# Patient Record
Sex: Female | Born: 2005 | Race: Black or African American | Hispanic: No | Marital: Single | Smoking: Never smoker
Health system: Southern US, Community
[De-identification: ages and names within clinical notes are randomized; demographics above are authoritative.]

## PROBLEM LIST (undated history)

## (undated) ENCOUNTER — Emergency Department (HOSPITAL_COMMUNITY): Admission: EM | Payer: 59 | Source: Home / Self Care

## (undated) ENCOUNTER — Ambulatory Visit (HOSPITAL_COMMUNITY): Admission: EM | Payer: 59 | Source: Home / Self Care

## (undated) DIAGNOSIS — D649 Anemia, unspecified: Secondary | ICD-10-CM

## (undated) DIAGNOSIS — R519 Headache, unspecified: Secondary | ICD-10-CM

## (undated) HISTORY — DX: Anemia, unspecified: D64.9

## (undated) HISTORY — PX: NO PAST SURGERIES: SHX2092

---
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
## 2023-01-01 ENCOUNTER — Encounter (HOSPITAL_COMMUNITY): Payer: Self-pay | Admitting: *Deleted

## 2023-01-01 ENCOUNTER — Other Ambulatory Visit: Payer: Self-pay

## 2023-01-01 ENCOUNTER — Inpatient Hospital Stay (HOSPITAL_COMMUNITY)
Admission: AD | Admit: 2023-01-01 | Discharge: 2023-01-01 | Disposition: A | Discharge status: Home / Self Care | Payer: 59 | Attending: Emergency Medicine | Admitting: Emergency Medicine

## 2023-01-01 DIAGNOSIS — W19XXXA Unspecified fall, initial encounter: Secondary | ICD-10-CM | POA: Diagnosis not present

## 2023-01-01 DIAGNOSIS — Y9302 Activity, running: Secondary | ICD-10-CM | POA: Insufficient documentation

## 2023-01-01 DIAGNOSIS — O99332 Smoking (tobacco) complicating pregnancy, second trimester: Secondary | ICD-10-CM | POA: Diagnosis not present

## 2023-01-01 DIAGNOSIS — O9A212 Injury, poisoning and certain other consequences of external causes complicating pregnancy, second trimester: Secondary | ICD-10-CM | POA: Diagnosis not present

## 2023-01-01 DIAGNOSIS — S0990XA Unspecified injury of head, initial encounter: Secondary | ICD-10-CM

## 2023-01-01 DIAGNOSIS — S80212A Abrasion, left knee, initial encounter: Secondary | ICD-10-CM

## 2023-01-01 DIAGNOSIS — S40212A Abrasion of left shoulder, initial encounter: Secondary | ICD-10-CM

## 2023-01-01 DIAGNOSIS — Z3A17 17 weeks gestation of pregnancy: Secondary | ICD-10-CM | POA: Diagnosis not present

## 2023-01-01 DIAGNOSIS — S0083XA Contusion of other part of head, initial encounter: Secondary | ICD-10-CM | POA: Diagnosis not present

## 2023-01-01 DIAGNOSIS — R110 Nausea: Secondary | ICD-10-CM | POA: Insufficient documentation

## 2023-01-01 DIAGNOSIS — S80211A Abrasion, right knee, initial encounter: Secondary | ICD-10-CM

## 2023-01-01 DIAGNOSIS — O26892 Other specified pregnancy related conditions, second trimester: Secondary | ICD-10-CM | POA: Insufficient documentation

## 2023-01-01 MED ORDER — ONDANSETRON 4 MG PO TBDP
4.0000 mg | ORAL_TABLET | Freq: Once | ORAL | Status: AC
  Administered 2023-01-01: 4 mg via ORAL
  Filled 2023-01-01: qty 1

## 2023-01-01 NOTE — MAU Note (Signed)
Pt says at 0300 while leaving work- was running in parking lot bc she thought an animal was chasing her.  She fell straight forward - and hit her forehead- on left above eye- is red.   And knees - both scrapped and left shoulder and right hand .  Now feels nausea- has headache and knees burn .

## 2023-01-01 NOTE — ED Provider Notes (Signed)
[place] EMERGENCY DEPARTMENT AT [facility] Provider Note   CSN: [id number] Arrival date & time: 01/01/23  0343     History  Chief Complaint  Patient presents with  . Fall    Jenny [name] is a 17 y.o. female.  17 yo F with a chief complaints of a fall.  The patient was running in a parking lot because she was worried an animal was after her and she lost her balance and fell.  She had up striking her head and skinned both of her knees.  She is [redacted] weeks pregnant and so she went to the [facility] to be evaluated.  They felt that there was unlikely to be an acute issue with her pregnancy and she was sent here for evaluation.  She denies any confusion denies vomiting denies blood thinner use.  She denies chest pain abdominal pain back pain or neck pain.   Fall      Home Medications Prior to Admission medications   Medication Sig Start Date End Date Taking? Authorizing Provider  fluconazole (DIFLUCAN) 150 MG tablet Take 1 tablet today and 1 tablet 3 days from now 07/16/22   Georges Mouse, NP      Allergies    Watermelon [citrullus vulgaris]    Review of Systems   Review of Systems  Physical Exam Updated Vital Signs BP 108/77 (BP Location: Left Arm)   Pulse (!) 101   Temp 98.9 F (37.2 C) (Oral)   Resp 18   Ht 5\' 4"  (1.626 m)   Wt 61 kg   LMP 09/05/2022   SpO2 100%   BMI 23.09 kg/m  Physical Exam Vitals and nursing note reviewed.  Constitutional:      General: She is not in acute distress.    Appearance: She is well-developed. She is not diaphoretic.  HENT:     Head: Normocephalic.     Comments: Abrasion to the left frontal region.  Extraocular motion intact.  No facial nerve palsy.  No obvious bony deformity to the skull. Eyes:     Pupils: Pupils are equal, round, and reactive to light.  Cardiovascular:     Rate and Rhythm: Normal rate and regular rhythm.     Heart sounds: No murmur heard.    No friction rub. No gallop.  Pulmonary:      Effort: Pulmonary effort is normal.     Breath sounds: No wheezing or rales.  Abdominal:     General: There is no distension.     Palpations: Abdomen is soft.     Tenderness: There is no abdominal tenderness.  Musculoskeletal:        General: No tenderness.     Cervical back: Normal range of motion and neck supple.     Comments: Abrasion to bilateral knees.  Able to range without any obvious bony tenderness.  Palpated from head to toe without any noted areas of bony tenderness.  No midline spinal tenderness about her deformities.  She is able to rotate her head 45 degrees in either direction without pain.  Skin:    General: Skin is warm and dry.  Neurological:     Mental Status: She is alert and oriented to person, place, and time.  Psychiatric:        Behavior: Behavior normal.    ED Results / Procedures / Treatments   Labs (all labs ordered are listed, but only abnormal results are displayed) Labs Reviewed - No data to display  EKG  None  Radiology No results found.  Procedures Procedures    Medications Ordered in ED Medications  ondansetron (ZOFRAN-ODT) disintegrating tablet 4 mg (4 mg Oral Given 01/01/23 0423)    ED Course/ Medical Decision Making/ A&P                                 Medical Decision Making  17 yo F with a chief complaints of fall from standing.  I was able to clear her head with [place] head CT rules.  She has no neck pain.  She skinned both of her knees.  I do not feel she would be benefited by any imaging.  She is currently [redacted] weeks pregnant as well.  Tylenol for discomfort.  PCP follow-up.  I did review the patient's medical record and her last tetanus was updated in 2016.   5:10 AM:  I have discussed the diagnosis/risks/treatment options with the patient and friend .  Evaluation and diagnostic testing in the emergency department does not suggest an emergent condition requiring admission or immediate intervention beyond what has been performed  at this time.  They will follow up with PCP. We also discussed returning to the ED immediately if new or worsening sx occur. We discussed the sx which are most concerning (e.g., sudden worsening pain, fever, inability to tolerate by mouth) that necessitate immediate return. Medications administered to the patient during their visit and any new prescriptions provided to the patient are listed below.  Medications given during this visit Medications  ondansetron (ZOFRAN-ODT) disintegrating tablet 4 mg (4 mg Oral Given 01/01/23 0423)     The patient appears reasonably screen and/or stabilized for discharge and I doubt any other medical condition or other [facility] requiring further screening, evaluation, or treatment in the ED at this time prior to discharge.           Final Clinical Impression(s) / ED Diagnoses Final diagnoses:  Contusion, brow, initial encounter  Fall, initial encounter  [redacted] weeks gestation of pregnancy  Abrasion of both knees  Abrasion of left shoulder, initial encounter  Acute headache due to traumatic injury of head  Nausea    Rx / DC Orders ED Discharge Orders          Ordered    Discharge patient        01/01/23 0430              Melene Plan, DO 01/01/23 0510

## 2023-01-01 NOTE — Progress Notes (Signed)
Transferring patient to main ED for evaluation and treatment

## 2023-01-01 NOTE — Discharge Instructions (Signed)
Please return for sudden worsening headache confusion or vomiting.  Please follow-up with your family doctor in the office.  Follow-up with your OB/GYN as well.  I would rinse out your cuts as best as you can under the shower this evening.  You can then apply an ointment a couple times a day.  Typically the plastic surgeons and dermatologist recommend Vaseline.  Please return for redness drainage or fever.

## 2023-01-01 NOTE — MAU Provider Note (Signed)
Chief Complaint:  Fall   None    HPI: Jenny [name] is a 17 y.o. G1P0 at 67w3dwho presents to maternity admissions reporting having fallen while running away from an animal in a parking lot tonight.  Fell straight forward onto her face, hitting her forehead. Also skinned her knees. Left shoulder and right hand.  C/O severe nausea and headache, rates it a "9". She denies LOF, vaginal bleeding, fever/chills.   Fall The accident occurred 1 to 3 hours ago. The fall occurred while running. She landed on [place]. There was no blood loss. The point of impact was the head, face, left shoulder, left knee, right wrist and right knee. The pain is present in the head, back, left knee, right wrist, right knee and left shoulder. The pain is at a severity of 9/10. Associated symptoms include headaches and nausea. Pertinent negatives include no loss of consciousness. She has tried nothing for the symptoms.   RN Note: Pt says at 0300 while leaving work- was running in parking lot bc she thought an animal was chasing her.  She fell straight forward - and hit her forehead- on left above eye- is red.   And knees - both scrapped and left shoulder and right hand .  Now feels nausea- has headache and knees burn .  Past Medical History: Past Medical History:  Diagnosis Date   Headache     Past obstetric history: OB History  Gravida Para Term Preterm AB Living  1            SAB IAB Ectopic Multiple Live Births               # Outcome Date GA Lbr Len/2nd Weight Sex Type Anes PTL Lv  1 Current             Past Surgical History: No past surgical history on file.  Family History: Family History  Problem Relation Age of Onset   Hypertension Father    Cancer Maternal Grandfather        LUNG   Asthma Paternal Grandmother    Hypertension Paternal Grandmother    Hypertension Paternal Grandfather    Stroke Paternal Grandfather    Alcohol abuse Neg Hx    Arthritis Neg Hx    Birth defects Neg  Hx    COPD Neg Hx    Depression Neg Hx    Diabetes Neg Hx    Drug abuse Neg Hx    Early death Neg Hx    Hearing loss Neg Hx    Heart disease Neg Hx    Hyperlipidemia Neg Hx    Learning disabilities Neg Hx    Kidney disease Neg Hx    Mental illness Neg Hx    Mental retardation Neg Hx    Miscarriages / Stillbirths Neg Hx    Vision loss Neg Hx    Varicose Veins Neg Hx     Social History: Social History   Tobacco Use   Smoking status: Never    Passive exposure: Yes   Smokeless tobacco: Never   Tobacco comments:    vape  Vaping Use   Vaping status: Some Days  Substance Use Topics   Alcohol use: Never   Drug use: Never    Allergies:  Allergies  Allergen Reactions   Watermelon [Citrullus Vulgaris] Other (See Comments)    Mouth starts burning (patient reported)    Meds:  Medications Prior to Admission  Medication Sig Dispense Refill Last Dose  fluconazole (DIFLUCAN) 150 MG tablet Take 1 tablet today and 1 tablet 3 days from now 2 tablet 0    metroNIDAZOLE (FLAGYL) 500 MG tablet Take 1 tablet (500 mg total) by mouth 2 (two) times daily. 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 (Patient not taking: Reported on 07/15/2022) 30 tablet 0    valACYclovir (VALTREX) 1000 MG tablet Take 1 tablet (1,000 mg total) by mouth daily. 60 tablet 0     I have reviewed patient's Past Medical Hx, Surgical Hx, Family Hx, Social Hx, medications and allergies.   ROS:  Review of Systems  Gastrointestinal:  Positive for nausea.  Neurological:  Positive for headaches. Negative for loss of consciousness.   Other systems negative  Physical Exam  Patient Vitals for the past 24 hrs:  BP Temp Pulse Resp Height Weight  01/01/23 0401 109/73 98.2 F (36.8 C) (!) 102 18 5\' 4"  (1.626 m) 61 kg   Constitutional: Well-developed, well-nourished female in no acute distress.   Cardiovascular: normal rate  Respiratory: normal effort GI: Abd soft, non-tender, gravid appropriate for gestational age.   No rebound or guarding. MS: Extremities nontender, no edema, normal ROM Neurologic: Alert and oriented x 4.  GU: Neg CVAT.   FHT:  150     Labs: No results found for this or any previous visit (from the past 24 hour(s)).    Imaging:  No results found.  MAU Course/MDM: I have reviewed the triage vital signs and the nursing notes.   Pertinent labs & imaging results that were available during my care of the patient were reviewed by me and considered in my medical decision making (see chart for details).      I have reviewed her medical records including past results, notes and treatments.   I have ordered Zofran ODT for nausea prior to transport.   Consult Dr Pilar Plate with presentation, exam findings.  Treatments in MAU included Zofran.    Assessment: [place] IUP at [redacted]w[redacted]d Fall Left brow contusion Bilateral abrasions to knees Left shoulder abrasion Severe headache Severe nausea  Plan: Discharge to Main ED, Dr Pilar Plate accepting Escorted by wheelchair  Follow up in Office for prenatal visits and recheck  Pt stable at time of Transfer Encouraged to return if she develops worsening of symptoms, increase in pain, fever, or other concerning symptoms.    Wynelle Bourgeois CNM, MSN Certified Nurse-Midwife 01/01/2023 4:19 AM

## 2023-01-01 NOTE — ED Triage Notes (Addendum)
Patient transferred over from MAU after evaluation from a fall she had at 3 am today. States she tripped and fell while running and fell onto concrete hitting her head. Abrasions noted to the knees as well. Patient Aox4. Patient is [redacted] weeks pregnant.

## 2023-01-02 ENCOUNTER — Encounter: Payer: Self-pay | Admitting: Family

## 2023-01-07 ENCOUNTER — Encounter: Payer: Self-pay | Admitting: Pediatrics

## 2023-01-08 ENCOUNTER — Ambulatory Visit (INDEPENDENT_AMBULATORY_CARE_PROVIDER_SITE_OTHER): Payer: 59 | Admitting: *Deleted

## 2023-01-08 DIAGNOSIS — Z3402 Encounter for supervision of normal first pregnancy, second trimester: Secondary | ICD-10-CM

## 2023-01-08 DIAGNOSIS — Z3480 Encounter for supervision of other normal pregnancy, unspecified trimester: Secondary | ICD-10-CM | POA: Insufficient documentation

## 2023-01-08 DIAGNOSIS — Z3A18 18 weeks gestation of pregnancy: Secondary | ICD-10-CM

## 2023-01-08 DIAGNOSIS — Z3482 Encounter for supervision of other normal pregnancy, second trimester: Secondary | ICD-10-CM

## 2023-01-08 MED ORDER — BLOOD PRESSURE KIT DEVI
1.0000 | 0 refills | Status: AC
Start: 2023-01-08 — End: ?

## 2023-01-08 NOTE — Patient Instructions (Signed)

## 2023-01-08 NOTE — Progress Notes (Signed)
New OB Intake  I connected with Jenny [name]  on 01/08/23 at  1:10 PM EDT by Virtual Visit and verified that I am speaking with the correct person using two identifiers. Nurse is located at [facility] and pt is located at Home.  I discussed the limitations, risks, security and privacy concerns of performing an evaluation and management service by telephone and the availability of in person appointments. I also discussed with the patient that there may be a patient responsible charge related to this service. The patient expressed understanding and agreed to proceed.  I explained I am completing New OB Intake today. We discussed EDD of 06/08/2023, by Ultrasound. Pt is G1P0000. I reviewed her allergies, medications and Medical/Surgical/OB history.    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 Discussed the nature of our practice with multiple providers including residents and students. Due to the size of the practice, the delivering provider may not be the same as those providing prenatal care.   Patient is not interested in water birth. Offered upcoming OB visit with CNM to discuss further.  MyChart/Babyscripts MyChart access verified. I explained pt will have some visits in office and some virtually. Babyscripts instructions given and order placed. Patient verifies receipt of registration text/e-mail. Account successfully created and app downloaded.  Blood Pressure Cuff/Weight Scale Blood pressure cuff ordered for patient to pick-up from [employer]. Explained after first prenatal appt pt will check weekly and document  in Babyscripts. Patient does not have weight scale; patient may purchase if they desire to track weight weekly in Babyscripts.  Anatomy US Explained first scheduled [place] will be around 19 weeks. Anatomy US scheduled for 01/30/23 at 12:15.  Interested in [place]? If yes, send referral and doula dot phrase.   Is patient a candidate for Babyscripts Optimization? Yes  First visit review I reviewed new OB appt with patient. Explained pt will be seen by Dr. Debroah Loop at first visit. Discussed Avelina Laine genetic screening with patient. NA Panorama and Horizon.. Routine prenatal labs  not collected/ virtual visit/ had labs at [facility]    Last Pap No results found for: "DIAGPAP"  Harrel Lemon, RN 01/08/2023  2:01 PM

## 2023-01-29 ENCOUNTER — Ambulatory Visit (HOSPITAL_COMMUNITY)
Admission: EM | Admit: 2023-01-29 | Discharge: 2023-01-29 | Discharge status: Against Medical Advice | Payer: 59 | Attending: Sports Medicine | Admitting: Sports Medicine

## 2023-01-29 ENCOUNTER — Encounter (HOSPITAL_COMMUNITY): Payer: Self-pay

## 2023-01-29 DIAGNOSIS — Z3A21 21 weeks gestation of pregnancy: Secondary | ICD-10-CM | POA: Insufficient documentation

## 2023-01-29 DIAGNOSIS — R519 Headache, unspecified: Secondary | ICD-10-CM | POA: Insufficient documentation

## 2023-01-29 DIAGNOSIS — O99512 Diseases of the respiratory system complicating pregnancy, second trimester: Secondary | ICD-10-CM | POA: Insufficient documentation

## 2023-01-29 DIAGNOSIS — J029 Acute pharyngitis, unspecified: Secondary | ICD-10-CM | POA: Insufficient documentation

## 2023-01-29 DIAGNOSIS — Z1152 Encounter for screening for COVID-19: Secondary | ICD-10-CM | POA: Diagnosis not present

## 2023-01-29 LAB — POCT RAPID STREP A (OFFICE): Rapid Strep A Screen: NEGATIVE

## 2023-01-29 LAB — SARS CORONAVIRUS 2 (TAT 6-24 HRS): SARS Coronavirus 2: NEGATIVE

## 2023-01-29 NOTE — ED Triage Notes (Signed)
Patient here today with c/o ST and headaches X 3 days. She has tried taking Nyquil with some relief. She is [redacted] weeks pregnant. Her cousin has also been sick.

## 2023-01-30 ENCOUNTER — Ambulatory Visit: Payer: 59 | Admitting: *Deleted

## 2023-01-30 ENCOUNTER — Other Ambulatory Visit: Payer: Self-pay | Admitting: *Deleted

## 2023-01-30 ENCOUNTER — Ambulatory Visit: Payer: 59 | Attending: Obstetrics and Gynecology

## 2023-01-30 VITALS — BP 120/77 | HR 108

## 2023-01-30 DIAGNOSIS — O98312 Other infections with a predominantly sexual mode of transmission complicating pregnancy, second trimester: Secondary | ICD-10-CM | POA: Insufficient documentation

## 2023-01-30 DIAGNOSIS — Z363 Encounter for antenatal screening for malformations: Secondary | ICD-10-CM | POA: Insufficient documentation

## 2023-01-30 DIAGNOSIS — Z3482 Encounter for supervision of other normal pregnancy, second trimester: Secondary | ICD-10-CM | POA: Diagnosis not present

## 2023-01-30 DIAGNOSIS — A6000 Herpesviral infection of urogenital system, unspecified: Secondary | ICD-10-CM | POA: Insufficient documentation

## 2023-01-30 DIAGNOSIS — Z3A21 21 weeks gestation of pregnancy: Secondary | ICD-10-CM | POA: Insufficient documentation

## 2023-01-30 DIAGNOSIS — Z362 Encounter for other antenatal screening follow-up: Secondary | ICD-10-CM

## 2023-01-30 DIAGNOSIS — O09892 Supervision of other high risk pregnancies, second trimester: Secondary | ICD-10-CM

## 2023-02-03 ENCOUNTER — Inpatient Hospital Stay (HOSPITAL_COMMUNITY)
Admission: AD | Admit: 2023-02-03 | Discharge: 2023-02-03 | Disposition: A | Discharge status: Home / Self Care | Payer: 59 | Attending: Obstetrics and Gynecology | Admitting: Obstetrics and Gynecology

## 2023-02-03 ENCOUNTER — Encounter (HOSPITAL_COMMUNITY): Payer: Self-pay | Admitting: Obstetrics and Gynecology

## 2023-02-03 DIAGNOSIS — O212 Late vomiting of pregnancy: Secondary | ICD-10-CM | POA: Diagnosis not present

## 2023-02-03 DIAGNOSIS — O98512 Other viral diseases complicating pregnancy, second trimester: Secondary | ICD-10-CM | POA: Diagnosis present

## 2023-02-03 DIAGNOSIS — O99512 Diseases of the respiratory system complicating pregnancy, second trimester: Secondary | ICD-10-CM | POA: Diagnosis not present

## 2023-02-03 DIAGNOSIS — B9789 Other viral agents as the cause of diseases classified elsewhere: Secondary | ICD-10-CM | POA: Diagnosis not present

## 2023-02-03 DIAGNOSIS — Z1152 Encounter for screening for COVID-19: Secondary | ICD-10-CM | POA: Diagnosis not present

## 2023-02-03 DIAGNOSIS — Z3A22 22 weeks gestation of pregnancy: Secondary | ICD-10-CM | POA: Diagnosis not present

## 2023-02-03 DIAGNOSIS — J069 Acute upper respiratory infection, unspecified: Secondary | ICD-10-CM | POA: Insufficient documentation

## 2023-02-03 LAB — URINALYSIS, ROUTINE W REFLEX MICROSCOPIC
Bilirubin Urine: NEGATIVE
Glucose, UA: NEGATIVE mg/dL
Hgb urine dipstick: NEGATIVE
Ketones, ur: NEGATIVE mg/dL
Nitrite: NEGATIVE
Protein, ur: NEGATIVE mg/dL
Specific Gravity, Urine: 1.006 (ref 1.005–1.030)
pH: 7 (ref 5.0–8.0)

## 2023-02-03 LAB — RESP PANEL BY RT-PCR (RSV, FLU A&B, COVID)  RVPGX2
Influenza A by PCR: NEGATIVE
Influenza B by PCR: NEGATIVE
Resp Syncytial Virus by PCR: NEGATIVE
SARS Coronavirus 2 by RT PCR: NEGATIVE

## 2023-02-03 MED ORDER — PROMETHAZINE HCL 25 MG PO TABS
12.5000 mg | ORAL_TABLET | Freq: Once | ORAL | Status: AC
  Administered 2023-02-03: 12.5 mg via ORAL
  Filled 2023-02-03: qty 1

## 2023-02-03 MED ORDER — PROMETHAZINE HCL 25 MG PO TABS: 12.5000 mg | ORAL_TABLET | Freq: Four times a day (QID) | ORAL | 5 refills | Status: DC | PRN

## 2023-02-03 MED ORDER — PRENATAL VITAMIN 27-0.8 MG PO TABS: 1.0000 | ORAL_TABLET | Freq: Every day | ORAL | 11 refills | Status: AC

## 2023-02-03 MED ORDER — ACETAMINOPHEN 500 MG PO TABS
1000.0000 mg | ORAL_TABLET | Freq: Once | ORAL | Status: AC
  Administered 2023-02-03: 1000 mg via ORAL
  Filled 2023-02-03: qty 2

## 2023-02-03 NOTE — MAU Note (Signed)
Jenny [name] is a 17 y.o. at [redacted]w[redacted]d here in MAU reporting: been running a fever for like a wk, has "been in the 100's, today it was 102.1".  having chills every day.  Can't really eat, when she does- she throws up.  (Also for a wk).  Initially had a sore throat- no longer does.  Non- productive cough.  Cousin and aunt had the same thing, has been tested for covid and strep- were neg.Denies diarrhea. Denies urinary problems or back pain. Onset of complaint: a wk Pain score: HA 7 Vitals:   02/03/23 1344  BP: 114/70  Pulse: (!) 120  Resp: (!) 22  Temp: (!) 103 F (39.4 C)  SpO2: 100%     FHT:177 Lab orders placed from triage:  urine

## 2023-02-03 NOTE — MAU Provider Note (Signed)
History     [id number]  Arrival date and time: 02/03/23 1307    Chief Complaint  Patient presents with   Fever   Headache   Emesis     HPI Jenny [name] is a 17 y.o. at [redacted]w[redacted]d, who presents for fever.   Patient reports about one week of symptoms including fevers, nasal congestion, and mild non-productive cough In addition has also had lots of nausea and vomiting, has been able to drink water but is not eating much Has taken zofran but not working for her nausea Has taken tylenol which is somewhat effective for fever Cousin and and aunt had the same symptoms Also having a mild headache No shortness of breath or chest pain No abdominal pain, no diarrhea No burning or pain with urination No back pain No vaginal discharge No vaginal bleeding No contractions No leaking fluid   O/Positive/-- (07/29 0000)  OB History     Gravida  1   Para  0   Term  0   Preterm  0   AB  0   Living  0      SAB  0   IAB  0   Ectopic  0   Multiple  0   Live Births  0           Past Medical History:  Diagnosis Date   Anemia    BMI (body mass index), pediatric, 5% to less than 85% for age 42/02/2015   Encounter for routine child health examination without abnormal findings 02/07/2015   Headache    Vulvar pain 09/25/2021    Past Surgical History:  Procedure Laterality Date   NO PAST SURGERIES      Family History  Problem Relation Age of Onset   Hypertension Father    Asthma Paternal Grandmother    Hypertension Paternal Grandmother    Cancer Paternal Grandfather    Hypertension Paternal Grandfather    Stroke Paternal Grandfather    Alcohol abuse Neg Hx    Arthritis Neg Hx    Birth defects Neg Hx    COPD Neg Hx    Depression Neg Hx    Diabetes Neg Hx    Drug abuse Neg Hx    Early death Neg Hx    Hearing loss Neg Hx    Heart disease Neg Hx    Hyperlipidemia Neg Hx    Learning disabilities Neg Hx    Kidney disease Neg Hx    Mental  illness Neg Hx    Mental retardation Neg Hx    Miscarriages / Stillbirths Neg Hx    Vision loss Neg Hx    Varicose Veins Neg Hx     Social History   Socioeconomic History   Marital status: Single    Spouse name: Not on file   Number of children: Not on file   Years of education: Not on file   Highest education level: Not on file  Occupational History   Not on file  Tobacco Use   Smoking status: Never    Passive exposure: Yes   Smokeless tobacco: Never   Tobacco comments:    vape  Vaping Use   Vaping status: Former   Quit date: 09/27/2021  Substance and Sexual Activity   Alcohol use: Never   Drug use: Never   Sexual activity: Yes    Birth control/protection: None  Other Topics Concern   Not on file  Social History Narrative   11th grade at [employer]  High   Social Determinants of Health   Financial Resource Strain: Not on file  Food Insecurity: Not on file  Transportation Needs: Not on file  Physical Activity: Not on file  Stress: Not on file  Social Connections: Not on file  Intimate Partner Violence: Not on file    Allergies  Allergen Reactions   Watermelon [Citrullus Vulgaris] Other (See Comments)    Mouth starts burning (patient reported)    No current facility-administered medications on file prior to encounter.   Current Outpatient Medications on File Prior to Encounter  Medication Sig Dispense Refill   Blood Pressure Monitoring (BLOOD PRESSURE KIT) DEVI 1 Device by Does not apply route once a week. (Patient not taking: Reported on 01/30/2023) 1 each 0     ROS Pertinent positives and negative per HPI, all others reviewed and negative  Physical Exam   BP 126/81   Pulse 94   Temp (!) 102 F (38.9 C) (Oral)   Resp (!) 24   Ht 5\' 4"  (1.626 m)   Wt 62.2 kg   LMP 09/01/2022   SpO2 100%   BMI 23.55 kg/m   Patient Vitals for the past 24 hrs:  BP Temp Temp src Pulse Resp SpO2 Height Weight  02/03/23 1616 -- (!) 102 F (38.9 C) Oral -- -- -- --  --  02/03/23 1400 126/81 -- -- 94 (!) 24 -- -- --  02/03/23 1344 114/70 (!) 103 F (39.4 C) Oral (!) 120 (!) 22 100 % 5\' 4"  (1.626 m) 62.2 kg    Physical Exam Constitutional:      General: She is not in acute distress.    Appearance: She is well-developed. She is not ill-appearing, toxic-appearing or diaphoretic.  HENT:     Head: Normocephalic and atraumatic.     Mouth/Throat:     Mouth: Mucous membranes are moist.  Cardiovascular:     Rate and Rhythm: Normal rate and regular rhythm.     Heart sounds: Normal heart sounds.  Pulmonary:     Effort: Pulmonary effort is normal. No respiratory distress.     Breath sounds: Normal breath sounds. No stridor. No wheezing, rhonchi or rales.  Abdominal:     General: Bowel sounds are normal. There is no distension.     Palpations: Abdomen is soft.     Tenderness: There is no abdominal tenderness.  Skin:    General: Skin is warm and dry.  Neurological:     Mental Status: She is alert.     Coordination: Coordination normal.     Gait: Gait normal.  Psychiatric:        Mood and Affect: Mood normal.        Behavior: Behavior normal.      Cervical Exam    Bedside Ultrasound Not performed.  My interpretation: n/a   Labs Results for orders placed or performed during the hospital encounter of 02/03/23 (from the past 24 hour(s))  Urinalysis, Routine w reflex microscopic -Urine, Clean Catch     Status: Abnormal   Collection Time: 02/03/23  2:01 PM  Result Value Ref Range   Color, Urine YELLOW YELLOW   APPearance HAZY (A) CLEAR   Specific Gravity, Urine 1.006 1.005 - 1.030   pH 7.0 5.0 - 8.0   Glucose, UA NEGATIVE NEGATIVE mg/dL   Hgb urine dipstick NEGATIVE NEGATIVE   Bilirubin Urine NEGATIVE NEGATIVE   Ketones, ur NEGATIVE NEGATIVE mg/dL   Protein, ur NEGATIVE NEGATIVE mg/dL   Nitrite NEGATIVE NEGATIVE  Leukocytes,Ua LARGE (A) NEGATIVE   RBC / HPF 0-5 0 - 5 RBC/hpf   WBC, UA 11-20 0 - 5 WBC/hpf   Bacteria, UA RARE (A) NONE  SEEN   Squamous Epithelial / HPF 11-20 0 - 5 /HPF  Resp panel by RT-PCR (RSV, Flu A&B, Covid) Anterior Nasal Swab     Status: None   Collection Time: 02/03/23  2:43 PM   Specimen: Anterior Nasal Swab  Result Value Ref Range   SARS Coronavirus 2 by RT PCR NEGATIVE NEGATIVE   Influenza A by PCR NEGATIVE NEGATIVE   Influenza B by PCR NEGATIVE NEGATIVE   Resp Syncytial Virus by PCR NEGATIVE NEGATIVE    Imaging No results found.  MAU Course  Procedures  Lab Orders         Resp panel by RT-PCR (RSV, Flu A&B, Covid) Anterior Nasal Swab         Urinalysis, Routine w reflex microscopic -Urine, Clean Catch    Meds ordered this encounter  Medications   acetaminophen (TYLENOL) tablet 1,000 mg   promethazine (PHENERGAN) tablet 12.5 mg   Prenatal Vit-Fe Fumarate-FA (PRENATAL VITAMIN) 27-0.8 MG TABS    Sig: Take 1 tablet by mouth daily.    Dispense:  30 tablet    Refill:  11   promethazine (PHENERGAN) 25 MG tablet    Sig: Take 0.5 tablets (12.5 mg total) by mouth every 6 (six) hours as needed for nausea or vomiting.    Dispense:  30 tablet    Refill:  5   Imaging Orders  No imaging studies ordered today    MDM Moderate (Level 3-4)  Assessment and Plan  # Viral URI with ocugh #[redacted] weeks gestation of pregnancy Well appearing on exam, no signs of respiratory distress or dehydration. Respiratory viral panel negative. Supportive care with tylenol, fluids. Given phenergan for home.    Dispo: discharged to home in stable condition   Venora Maples, MD/MPH 02/03/23 4:20 PM  Allergies as of 02/03/2023       Reactions   Watermelon [citrullus Vulgaris] Other (See Comments)   Mouth starts burning (patient reported)        Medication List     TAKE these medications    Blood Pressure Kit Devi 1 Device by Does not apply route once a week.   Prenatal Vitamin 27-0.8 MG Tabs Take 1 tablet by mouth daily. What changed: medication strength   promethazine 25 MG  tablet Commonly known as: PHENERGAN Take 0.5 tablets (12.5 mg total) by mouth every 6 (six) hours as needed for nausea or vomiting.

## 2023-02-04 ENCOUNTER — Ambulatory Visit: Payer: 59 | Admitting: Obstetrics & Gynecology

## 2023-02-04 VITALS — BP 106/72 | HR 99 | Wt 141.0 lb

## 2023-02-04 DIAGNOSIS — Z3A22 22 weeks gestation of pregnancy: Secondary | ICD-10-CM

## 2023-02-04 DIAGNOSIS — Z3402 Encounter for supervision of normal first pregnancy, second trimester: Secondary | ICD-10-CM | POA: Diagnosis not present

## 2023-02-04 DIAGNOSIS — Z3480 Encounter for supervision of other normal pregnancy, unspecified trimester: Secondary | ICD-10-CM

## 2023-02-04 MED ORDER — FERROUS SULFATE 325 (65 FE) MG PO TABS
325.0000 mg | ORAL_TABLET | ORAL | 3 refills | Status: AC
Start: 2023-02-04 — End: ?

## 2023-02-04 NOTE — Progress Notes (Signed)
  Subjective: transfer from [facility]     [employer] is a G1P0000 [redacted]w[redacted]d being seen today for her first obstetrical visit.  Her obstetrical history is significant for  anemia . Patient does intend to breast feed. Pregnancy history fully reviewed.  Patient reports no complaints.  Vitals:   02/04/23 1057  BP: 106/72  Pulse: 99  Weight: 141 lb (64 kg)    HISTORY: OB History  Gravida Para Term Preterm AB Living  1 0 0 0 0 0  SAB IAB Ectopic Multiple Live Births  0 0 0 0 0    # Outcome Date GA Lbr Len/2nd Weight Sex Type Anes PTL Lv  1 Current            Past Medical History:  Diagnosis Date   Anemia    BMI (body mass index), pediatric, 5% to less than 85% for age 47/02/2015   Encounter for routine child health examination without abnormal findings 02/07/2015   Headache    Vulvar pain 09/25/2021   Past Surgical History:  Procedure Laterality Date   NO PAST SURGERIES     Family History  Problem Relation Age of Onset   Hypertension Father    Asthma Paternal Grandmother    Hypertension Paternal Grandmother    Cancer Paternal Grandfather    Hypertension Paternal Grandfather    Stroke Paternal Grandfather    Alcohol abuse Neg Hx    Arthritis Neg Hx    Birth defects Neg Hx    COPD Neg Hx    Depression Neg Hx    Diabetes Neg Hx    Drug abuse Neg Hx    Early death Neg Hx    Hearing loss Neg Hx    Heart disease Neg Hx    Hyperlipidemia Neg Hx    Learning disabilities Neg Hx    Kidney disease Neg Hx    Mental illness Neg Hx    Mental retardation Neg Hx    Miscarriages / Stillbirths Neg Hx    Vision loss Neg Hx    Varicose Veins Neg Hx      Exam    Uterus:   22 weeks  Pelvic Exam: deferred   Perineum:    Vulva:    Vagina:     pH:    Cervix:    Adnexa:    Bony Pelvis:   System: Breast:     Skin:     Neurologic: oriented, normal mood   Extremities: normal strength, tone, and muscle mass   HEENT PERRLA   Mouth/Teeth mucous membranes moist,  pharynx normal without lesions and dental hygiene good   Neck supple   Cardiovascular: regular rate and rhythm   Respiratory:  appears well, vitals normal, no respiratory distress, acyanotic, normal RR   Abdomen: soft, non-tender; bowel sounds normal; no masses,  no organomegaly   Urinary:       Assessment:    Pregnancy: G1P0000 Patient Active Problem List   Diagnosis Date Noted   Supervision of other normal pregnancy, antepartum 01/08/2023   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 Prenatal vitamins. Problem list reviewed and updated. Genetic Screening discussed : declined.  Ultrasound discussed; fetal survey: results reviewed.  Follow up in 5 weeks. 50% of 30 min visit spent on counseling and coordination of care.     Scheryl Darter 02/04/2023

## 2023-02-19 ENCOUNTER — Ambulatory Visit (HOSPITAL_COMMUNITY): Payer: Self-pay

## 2023-02-20 ENCOUNTER — Ambulatory Visit (HOSPITAL_COMMUNITY)
Admission: RE | Admit: 2023-02-20 | Discharge: 2023-02-20 | Disposition: A | Discharge status: Home / Self Care | Payer: 59 | Source: Ambulatory Visit | Attending: Emergency Medicine | Admitting: Emergency Medicine

## 2023-02-20 ENCOUNTER — Encounter (HOSPITAL_COMMUNITY): Payer: Self-pay

## 2023-02-20 VITALS — BP 114/68 | HR 74 | Temp 98.4°F | Resp 16

## 2023-02-20 DIAGNOSIS — N898 Other specified noninflammatory disorders of vagina: Secondary | ICD-10-CM | POA: Insufficient documentation

## 2023-02-20 NOTE — ED Triage Notes (Signed)
Patient is here today for vaginal discharge x 2 days. Pt states she is [redacted] weeks pregnant.

## 2023-02-20 NOTE — ED Provider Notes (Signed)
[facility]    CSN: [id number] Arrival date & time: 02/20/23  1326     History   Chief Complaint Chief Complaint  Patient presents with   Vaginal Discharge    Yeast check - Entered by patient    HPI Jenny [name] is a 17 y.o. female.  2 day history of vaginal discharge No odor or itching  She is [redacted] weeks pregnant. Followed by [facility]. Next appt is 10/31  In June was dx with yeast infection  Not having urinary symptoms. 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 Authorizing Provider  Blood Pressure Monitoring (BLOOD PRESSURE KIT) DEVI 1 Device by Does not apply route once a week. 01/08/23  Yes Warden Fillers, MD  Prenatal Vit-Fe Fumarate-FA (PRENATAL VITAMIN) 27-0.8 MG TABS Take 1 tablet by mouth daily. 02/03/23  Yes Venora Maples, MD  ferrous sulfate 325 (65 FE) MG tablet Take 1 tablet (325 mg total) by mouth every other day. 02/04/23   Adam Phenix, MD  promethazine (PHENERGAN) 25 MG tablet Take 0.5 tablets (12.5 mg total) by mouth every 6 (six) hours as needed for nausea or vomiting. 02/03/23   Venora Maples, MD    Family History Family  History  Problem Relation Age of Onset   Hypertension Father    Asthma Paternal Grandmother    Hypertension Paternal Grandmother    Cancer Paternal Grandfather    Hypertension Paternal Grandfather    Stroke Paternal Grandfather    Alcohol abuse Neg Hx    Arthritis Neg Hx    Birth defects Neg Hx    COPD Neg Hx    Depression Neg Hx    Diabetes Neg Hx    Drug abuse Neg Hx    Early death Neg Hx    Hearing loss Neg Hx    Heart disease Neg Hx    Hyperlipidemia Neg Hx    Learning disabilities Neg Hx    Kidney disease Neg Hx    Mental illness Neg Hx    Mental retardation Neg Hx    Miscarriages / Stillbirths Neg Hx    Vision loss Neg Hx    Varicose Veins Neg Hx     Social History Social History   Tobacco Use   Smoking status: Never    Passive exposure: Yes   Smokeless tobacco: Never   Tobacco comments:  vape  Vaping Use   Vaping status: Former   Quit date: 09/27/2021  Substance Use Topics   Alcohol use: Never   Drug use: Never     Allergies   Watermelon [citrullus vulgaris]   Review of Systems Review of Systems  Genitourinary:  Positive for vaginal discharge.   Per HPI  Physical Exam Triage Vital Signs ED Triage Vitals  Encounter Vitals Group     BP 02/20/23 1336 114/68     Systolic BP Percentile --      Diastolic BP Percentile --      Pulse Rate 02/20/23 1336 74     Resp 02/20/23 1336 16     Temp 02/20/23 1336 98.4 F (36.9 C)     Temp Source 02/20/23 1336 Oral     SpO2 02/20/23 1336 100 %     Weight --      Height --      Head Circumference --      Peak Flow --      Pain Score 02/20/23 1343 0     Pain Loc --      Pain Education --      Exclude from Growth Chart --    No data found.  Updated Vital Signs BP 114/68 (BP Location: Left Arm)   Pulse 74   Temp 98.4 F (36.9 C) (Oral)   Resp 16   LMP 09/01/2022   SpO2 100%   Physical Exam Vitals and nursing note reviewed.  Constitutional:      General: She is not in acute distress.     Appearance: Normal appearance.  Cardiovascular:     Rate and Rhythm: Normal rate and regular rhythm.     Pulses: Normal pulses.     Heart sounds: Normal heart sounds.  Pulmonary:     Effort: Pulmonary effort is normal.     Breath sounds: Normal breath sounds.  Abdominal:     Tenderness: There is no abdominal tenderness.     Comments: gravid  Neurological:     Mental Status: She is alert and oriented to person, place, and time.     UC Treatments / Results  Labs (all labs ordered are listed, but only abnormal results are displayed) Labs Reviewed  CERVICOVAGINAL ANCILLARY ONLY  CERVICOVAGINAL ANCILLARY ONLY    EKG   Radiology No results found.  Procedures Procedures (including critical care time)  Medications Ordered in UC Medications - No data to display  Initial Impression / Assessment and Plan / UC Course  I have reviewed the triage vital signs and the nursing notes.  Pertinent labs & imaging results that were available during my care of the patient were reviewed by me and considered in my medical decision making (see chart for details).  Cytoswab pending for BV/yeast along with STDs per patient request Await results and treat positive as indicated If yeast recommend clotrimazole vaginally x 7 nights If BV can do oral flagyl BID x 7 days since she is past 1st trimester  Final Clinical Impressions(s) / UC Diagnoses   Final diagnoses:  Vaginal discharge     Discharge Instructions      We will call you if anything on your swab returns positive. You can also see these results on MyChart. Please abstain from sexual intercourse until your results return.  Follow up with OB at next appointment      ED Prescriptions   None    PDMP not reviewed this encounter.   Olivia Royse, Ray Church 02/20/23  1511  

## 2023-02-20 NOTE — Discharge Instructions (Signed)
We will call you if anything on your swab returns positive. You can also see these results on MyChart. Please abstain from sexual intercourse until your results return.  Follow up with OB at next appointment

## 2023-02-21 ENCOUNTER — Telehealth (HOSPITAL_COMMUNITY): Payer: Self-pay | Admitting: Emergency Medicine

## 2023-02-21 MED ORDER — METRONIDAZOLE 0.75 % VA GEL: 1.0000 | Freq: Every day | VAGINAL | 0 refills | Status: AC

## 2023-02-21 MED ORDER — CLOTRIMAZOLE 1 % VA CREA: 1.0000 | TOPICAL_CREAM | Freq: Every day | VAGINAL | 0 refills | Status: DC

## 2023-02-21 NOTE — Telephone Encounter (Signed)
Metrogel for positve BV in pregnancy Clotrimazole for positive yeast in pregnancy

## 2023-02-24 LAB — CERVICOVAGINAL ANCILLARY ONLY
Bacterial Vaginitis (gardnerella): POSITIVE — AB
Candida Glabrata: NEGATIVE
Candida Vaginitis: POSITIVE — AB
Chlamydia: NEGATIVE
Comment: NEGATIVE
Comment: NEGATIVE
Comment: NEGATIVE
Comment: NORMAL
Comment: NEGATIVE
Comment: NEGATIVE
Neisseria Gonorrhea: NEGATIVE
Trichomonas: NEGATIVE

## 2023-02-27 ENCOUNTER — Ambulatory Visit: Payer: 59 | Attending: Maternal & Fetal Medicine

## 2023-02-27 ENCOUNTER — Other Ambulatory Visit: Payer: Self-pay

## 2023-02-27 DIAGNOSIS — Z362 Encounter for other antenatal screening follow-up: Secondary | ICD-10-CM | POA: Insufficient documentation

## 2023-02-27 DIAGNOSIS — Z3A25 25 weeks gestation of pregnancy: Secondary | ICD-10-CM | POA: Diagnosis not present

## 2023-02-27 DIAGNOSIS — B009 Herpesviral infection, unspecified: Secondary | ICD-10-CM

## 2023-02-27 DIAGNOSIS — O98512 Other viral diseases complicating pregnancy, second trimester: Secondary | ICD-10-CM

## 2023-03-04 ENCOUNTER — Ambulatory Visit (INDEPENDENT_AMBULATORY_CARE_PROVIDER_SITE_OTHER): Payer: 59 | Admitting: Obstetrics and Gynecology

## 2023-03-04 ENCOUNTER — Encounter: Payer: Self-pay | Admitting: Obstetrics and Gynecology

## 2023-03-04 ENCOUNTER — Other Ambulatory Visit: Payer: 59

## 2023-03-04 VITALS — BP 113/81 | HR 99 | Wt 145.0 lb

## 2023-03-04 DIAGNOSIS — B009 Herpesviral infection, unspecified: Secondary | ICD-10-CM

## 2023-03-04 DIAGNOSIS — Z3480 Encounter for supervision of other normal pregnancy, unspecified trimester: Secondary | ICD-10-CM

## 2023-03-04 DIAGNOSIS — O99013 Anemia complicating pregnancy, third trimester: Secondary | ICD-10-CM

## 2023-03-04 DIAGNOSIS — Z3A26 26 weeks gestation of pregnancy: Secondary | ICD-10-CM

## 2023-03-04 NOTE — Progress Notes (Signed)
   PRENATAL VISIT NOTE  Subjective:  Jenny [name] is a 17 y.o. G1P0000 at [redacted]w[redacted]d being seen today for ongoing prenatal care.  She is currently monitored for the following issues for this low-risk pregnancy and has HSV-2 infection and Supervision of other normal pregnancy, antepartum on their problem list.  Patient reports no complaints.  Contractions: Not present. Vag. Bleeding: None.  Movement: Present. Denies leaking of fluid.   The following portions of the patient's history were reviewed and updated as appropriate: allergies, current medications, past family history, past medical history, past social history, past surgical history and problem list.   Objective:   Vitals:   03/04/23 0907  BP: 113/81  Pulse: 99  Weight: 145 lb (65.8 kg)    Fetal Status: Fetal Heart Rate (bpm): 152   Movement: Present     General:  Alert, oriented and cooperative. Patient is in no acute distress.  Skin: Skin is warm and dry. No rash noted.   Cardiovascular: Normal heart rate noted  Respiratory: Normal respiratory effort, no problems with respiration noted  Abdomen: Soft, gravid, appropriate for gestational age.  Pain/Pressure: Absent     Pelvic: Cervical exam deferred        Extremities: Normal range of motion.  Edema: None  Mental Status: Normal mood and affect. Normal behavior. Normal judgment and thought content.   Assessment and Plan:  Pregnancy: G1P0000 at [redacted]w[redacted]d 1. Supervision of other normal pregnancy, antepartum Patient is doing well without complaints Third trimester labs today Patient undecided on contraception or pediatrician Patient interested in Doula services- referral placed - Glucose Tolerance, 2 Hours w/1 Hour - RPR - CBC - HIV antibody (with reflex)  2. [redacted] weeks gestation of pregnancy    Preterm labor symptoms and general obstetric precautions including but not limited to vaginal bleeding, contractions, leaking of fluid and fetal movement were reviewed in  detail with the patient. Please refer to After Visit Summary for other counseling recommendations.   Return in about 2 weeks (around 03/18/2023) for in person, ROB, Low risk.  Future Appointments  Date Time Provider Department Center  03/04/2023  9:35 AM Jakeria Caissie, Gigi Gin, MD [facility] None  03/18/2023  9:55 AM Reva Bores, MD [facility] None    Catalina Antigua, MD

## 2023-03-04 NOTE — Patient Instructions (Signed)
[facility] Pediatric Providers  [place] ([address]) [facility] Manson Passey, MD; Deirdre Priest, MD; Lum Babe, MD; Leveda Anna, MD; McDiarmid, MD; Jerene Bears, MD [address] [place]., [place], [place] [address] [phone] Mon-Fri 8:30-12:30, 1:30-5:00  Providers come to see babies during newborn hospitalization Only accepting infants of Mother's who are seen at [facility] or have siblings seen at   [facility] Medicaid - Yes; Tricare - Yes   [facility] [place], MD [address] [place], [place] [address] [phone] Mon, Tue, Thur, Fri 8:30-5:00, Wed 10:00-7:00 (closed 1-2pm daily for lunch) [facility] residents with no insurance.  [place] [employer] only with Medicaid/insurance; Tricare - no  [facility]) - Tim [facility], MD; Manson Passey, MD; Ave Filter, MD; Luna Fuse, MD; Kennedy Bucker, MD; Florestine Avers, MD; Melchor Amour, MD; Yetta Barre,  MD; Konrad Dolores, MD; Kathlene November, MD; Jenne Campus, MD; Wynetta Emery, MD; Duffy Rhody, MD; Gerre Couch, NP [address] [place], [place], [place] [address] [phone] Mon, Tue, Thur, Fri 8:30-5:30, Wed 9:30-5:30, Sat 8:30-12:30 Only accepting infants of first-time parents or siblings of current patients Hospital discharge coordinator will make follow-up appointment Medicaid - yes; Tricare - yes  [place] [phone]) [facility] Ilean China, MD; Earlene Plater, MD; Jamesetta Orleans, MD; Alvera Novel, MD; Rana Snare, MD; [facility], MD; Shaaron Adler, MD; Hosie Poisson, MD; Mayford Knife, MD [address], [place], [place] [address] [phone] Mon-Fri 8:30-5:00, closed for lunch 12:30-1:30; Sat-Sun 10:00-1:00 Accepting Newborns with commercial insurance only, must call prior to delivery to be accepted into  practice.  Medicaid - no, Tricare - yes   [facility] [address] [place], [place] [address] [phone] or [phone] Mon to Fri 8am to 10pm, Sat 8am to 1pm  (virtual only on weekends) Only accepts Medicaid Healthy Blue pts  [facility] ([facility]) - Pediatrics at Elige Radon, MD; Sabino Dick, MD; Quitman Livings, MD; Betha Loa, NP; Claretha Cooper, MD; Lelon Perla, MD [address] [place], [place] [address] [phone] Mon-Fri 8:30-5:30 Medicaid - yes, Tricare - yes  [place] [phone]) [facility] Marcie Mowers, MD [address], [place], [place] [address] [phone] Iona Hansen, Wed Fri 8:30-5:00, Sat 8:30-12:00, Closed Thursdays Accepting siblings of established patients and first time mom's if you call prenatally Medicaid- yes; Tricare - yes  [facility] Lutricia Feil, [place]; Tracie Harrier, MD; Rusty Aus; Scifres, PA; Wynelle Link, MD; Azucena Cecil, MD;  [address] [place], [place] [address] [phone] Mon-Fri 8:30-5:00, closed for lunch 1-2 Only accepting newborns of established patients Medicaid- no; Tricare - yes  [facility] [phone]) [facility] Morene Crocker, MD; [address], [place], [place] [address] [phone] Mon-Fri 8:00-5:00 Medicaid - No; Tricare - Yes  [facility], [place]; [place], [place] [address] [place], [place] [address] [phone] Mon-Fri 8:00-5:00 Medicaid - No, Tricare - Yes  [facility] Cardell Peach, MD; Nash Dimmer, MD; [place], [place] [address] [place], [place] [address] [phone]  Mon-Fri 8:00-5:00 Medicaid - No; Tricare - Yes  [facility] [address] [place], [place] [address] [phone] Mon-Fri 8:30-5:00 (lunch 12:00-1:00) Medicaid -Yes; Tricare - Yes  [facility] [place], MD [address] [place], [place], [place] [address] [phone] Mon-Fri 8:00-5:00 Seeing newborns of current patients only. No new patients Medicaid - No, Tricare - yes  Nature conservation officer at [place] [address] MD [address] [place], [place] [address] [phone] Mon-Fri 8:00-5:00 Medicaid -yes as secondary coverage only;  Tricare - yes  [facility] [place], [place]; [place], [place]; Avis Epley, MD; Vonna Kotyk, MD; Clance Boll, MD; [place], [place]; Smoot, NP; Vaughan Basta, MD; [place], MD [address] [place], [place] [address] [phone] Mon-Fri 8:30-5:00, Sat 9:00-11:00 Accepts commercial insurance ONLY. Offers free prenatal information sessions for families. Medicaid - No, Tricare - Call first  [facility] [place], MD; [place], [place]; [place], [place]; [place], [place] [address] [place] [address] [phone] Mon-Fri 7:30-5:30 Medicaid - Yes; Ailene Rud yes  [place] [phone] & [phone])  [facility], MD [address], [place], [place] [address] [phone] Mon-Thur 8:00-6:00, closed for lunch 12-2, closed Fridays Medicaid - yes; Tricare - no  [facility] Dareen Piano, NP; Cyndia Bent, MD; [place], [place]; [place], [place] [address], [place], [place] [address] [phone] Mon-Fri 7:30-4:30 Medicaid - yes, Tricare - yes  [facility]  Juanito Doom, MD; Janene Harvey, NP; Vonita Moss, MD; Donn Pierini, NP [address], [place], [place] [address] [phone] Mon-Fri 8:30-5:00, closed for lunch 1-2, Sat 8:30-12:00 - sick visits only Providers come to see babies at [facility] Only accepting newborns of siblings and first time parents ONLY if who have met with office prior to delivery Medicaid -Yes; Tricare - yes  [facility] - [place], [place]; Spero Geralds, NP; Earlene Plater, MD; Lucretia Roers, MD:  [address], [place], [place] [address] [phone] Mon- Fri 8:00-5:00, Sat 9:00-12:00 - sick visits only Accepting siblings of established patients and first time mom/baby Medicaid - Yes; Tricare - yes Patients must have vaccinations (baby vaccines)  [place] [phone] &  [phone])  Adult nurse HealthCare at [facility] [address] [place], [place] [address] [phone] Mon-Fri 8:00-5:00 Medicaid - no; Tricare - yes  [facility] [place], MD; [place], [place]; [place], [place] [address], [place], [place] [address] [phone] Mon-Fri 8:00-5:00 Medicaid- yes; Tricare - yes  [facility] - Ardeen Jourdain, MD; Yetta Barre, NP; [place], [place] [address] [place], [place], [place] [address] [phone] Mon-Fri 8:00-5:00 Medicaid - Yes; Tricare - yes  [address] [place] [phone])  [facility] [place], [place]; [place], [place]; Eddie Candle, MD; Normand Sloop, MD; [place], NP; Isenhour, DO; [place], [place]; Constance Goltz, MD; Ruthann Cancer, MD; Vear Clock, MD; [place], [place]; [place], [place]; [place], [place] 7124 [facility] [address], [place], [place] [address] [phone] Mon-Fri 8:30-5:00, Sat 9:00-12:00 - sick only Please register online [email or web address] then schedule online or call office Medicaid-Yes; Tricare -yes  [facility] - Premier  [place], MD; Romualdo Bolk, MD; Skill[place], MD; [place], NP; [place], [place]; Antonietta Barcelona, MD; Mayford Knife, NP; Shelva Majestic, MD [address], [place], [place] [address] [phone] Mon-Fri 8:00-5:30, Sat&Sun by appointment (phones open at 8:30) Medicaid - Yes; Tricare - yes  [place] [phone] & [phone]) [facility] Mariel Aloe; [place], MD; Roger Shelter, MD; Arvilla Market, NP; [place], DO [address], [place], [place] [address] [phone] M-F 8:00 - 5:15, Sat/Sun 9-12 sick visits only Medicaid - No; Tricare - yes  [facility]  [place], PA-C; [place], PA-C; [place], DO; [place], PA-C; [place], PA-C; Roselyn Bering, MD [address], [place], [place] [address] [phone] Mon-Thur 8:00-7:00, Fri 8:00-5:00 Accepting Medicaid for 13 and under only   [facility] Family Medicine  at [place] [facility] [place]) [place], [place]; List, FNP; Berneda Rose, MD; [place], PA-C; Scholer,  MD; Kellie Simmering, FNP; Genevie Cheshire, FNP; Evaristo Bury, MD; Berneda Rose, MD [phone] [address], [place], [place] [address] [phone] Mon-Fri 8:30-5:30 Medicaid - Yes; Tricare - yes  [facility] - [address]  [place], [place]; Whitney Post, MD; Hennie Duos, MD; Wynne Dust, MD; Moriarty, NP [address], [place], [place] [address] [phone] Mon-Thur 8:00-5:30, Fri 8:00-5:00, Sat 9:00-12:00 Medicaid - yes, Tricare - yes  [place] [phone])  [facility], [place]; Lenise Arena, MD; [place], [place] [address], [place], [place] [address] [phone] Mon-Fri 8:00-5:00, closed for lunch 12-1 Medicaid - No; Tricare - yes  Nature conservation officer at [place] [facility], MD [address] [place], [place] [address] [phone] Mon-Fri 8:00-5:00 Medicaid - No; Tricare - yes  [facility], MD; Tami Ribas, MD; Mariam Dollar, MD; Yetta Barre, MD [address], [place], [place] [address] [phone] Mon-Fri 8:00-5:00 Medicaid- Yes; Tricare - yes  [place] [phone])  Adult nurse HealthCare at [facility], [place]; [place], MD [address] [place], [place], [place] [address] [phone] Mon-Fri 8:00-5:00 Medicaid - No; Tricare - yes  [facility] - Whitney Post - CPNP [address] [place], [place], [place] [address] [phone] Mon-Weds 8:00-6:00, Thurs-Fri 8:00-5:00, Sat 9:00-12:00 Medicaid - yes; Tricare - yes   [facility] Katharina Caper, MD; [place], [place] [address] [place], [place] [address] [phone] Mon-Fri 8:00-5:00 Medicaid - yes; Tricare - yes  [facility] Pediatric Providers  [facility] [address] [place], [place] [address] [phone] Sheral Flow: 8am -8pm, Tues, Weds: 8am - 5pm; Fri: 8-1 Medicaid - Yes; Tricare -  yes  [facility] Rachel Bo, MD; Laural Benes, MD; Anner Crete, MD; [place], [place]; [place], [place] [address], [place], [place] [address] [phone] M-F 8:30 - 5:00 Medicaid - Call office; Tricare -yes  [facility]on Snowball, MD; Shanon Rosser, MD, Chelsea Primus, MD; Shirlyn Goltz, PNP; Wardell Heath, NP [phone] [address], [place], [place] [address] [phone] M-F 8:30 - 5:00, Sat/Sun 8:30 - 12:30 (sick visits) Medicaid - Call office; Tricare -yes  [facility] Melvyn Neth, MD; Karl Luke, PNP; Princess Bruins, MD; [place], [place]; [place], NP; Cynda Familia [address], [place], [place] [address] [phone] M-F 8:30 - 5:00 Medicaid - Call office; Tricare - yes  [facility] Jesusita Oka, MD; Dierdre Highman, MD; Earnest Conroy, MD; Timothy Lasso, MD; N[place], MD [phone] S[address] [place], [place] [address] [phone] M-Thur: 8:00 - 5:00; Fri: 8:00 - 4:00 Medicaid - yes; Tricare - yes  [facility] [address] [place], [place] [address] [phone] M-F: 8:30- 5:00, closed for lunch 12:30 - 1:00 Medicaid - yes; Tricare -yes  [facility] [address] [place], [place] [address] [phone] M-F 8:00 - 5:00 Medicaid - yes; Tricare - yes  [facility] [place], DO; [place], DO; [place], NP [address], [place], [place] [address] [phone] M-F 8:00 - 5:00, Closed 12-1 for lunch Medicaid - Call; Tricare - yes  [facility] Meredith Mody, MD [address], [place], [place] [address] [phone] M-F: 8:00-5:00, Sat: 8:00 - noon Medicaid - call; Tricare -yes  [facility] Pediatric Providers  [facility] [place], [place] [address] [place], [place], [place] [address] [phone] M-W: 8:00-5:00, Thur: 8:00 - 7:00, Fri: 8:00 - noon Medicaid - yes; Tricare - yes  [facility] - Quay Burow, FNP [address], [place],  [place] [address] [phone] M-F 8:00 - 5:00, Closed for lunch 12-1 Medicaid -  yes; Tricare - yes  [facility] Pediatric Providers  [facility] at [place], [place], Alinda Money, MD, [place], FNP-C [address], [facility], [address], [place], [place] [address] [phone] M-T 8:00-5:00, Wed-Fri 7:00-6:00 Medicaid - Yes; Tricare -yes  [facility] at [facility], [place]; [address] [place] Saner [place], [place] [address] [phone] M-F 8:00 - 5:00, closed for lunch 12-1 Medicaid - Yes; Tricare - yes  [facility] and Internal Medicine  Zachery Dauer, MD; Gladstone Lighter, MD; Collie Siad, MD; Freda Jackson, MD; Rich Number, MD; Darryl Nestle, MD; Melinda Crutch, MD, Audria Nine, MD; Tawanna Cooler, MD; Steffanie Dunn, MD; Byrd Hesselbach, MD; Lucretia Roers, MD [address] [place], [place] [address] [phone] M-F 8:00-5:00 Medicaid - yes; Tricare - yes  [facility] [place], MD (speaks [place] and Hindi) [address] [place], [place] [address] [phone] M-F: 8:30 - 5:00, closed 12:30 - 1 for lunch Medicaid - Yes; Tricare -yes  [facility] Providers  [facility] Shanda Bumps, MD; Chanetta Marshall, MD; Laurell Josephs, MD [address] [place], [place] [address] [phone] M-Th: 8:00 - 5:30, Fri: 8:00 - 12:00 Medicaid - yes; Tricare - yes  [facility] at [facility], NP; Thora Lance, MD; Orrin Brigham, MD 207 249 [address] [place], [place] [address] [phone] M-F: 8:00 - 5:00 Medicaid - yes; Tricare - yes  [facility] [place], NP; Orson Slick, NP; Salley Scarlet, NP; Linton Flemings, MD; Mayford Knife, MD, [place], NP, Emelda Fear, MD; Nida Boatman [address], [place], [place] [address] [phone] M-F: 8:30 - 5:30p Medicaid - yes; Tricare - yes Other locations available as well  [facility], MD; Andrey Campanile, MD; Neville Route, PA-C [address], [place], [place] [address] [phone] M-W: 8:00am - 7:00pm, Thurs: 8:00am - 8:00pm; Fri: 8:00am -  5:00pm, closed daily from 12-1 for lunch Medicaid - yes; Tricare - yes  [facility] Pediatric Providers  [facility] Pediatrics at Levin Erp, MD; Aggie Cosier, FNP; Bland Span, MD; Tristan Schroeder, MD; [place], PNP; Alesia Banda; [place], [place]; Julian Reil, MD;  [address] [place], [place] [address] [phone] Judie Petit - Caleen Essex: 8am - 5pm, Sat 9-noon Medicaid - Yes; Tricare -yes  [facility] at Jaclynn Guarneri, MD; Yetta Barre, FNP; Lilian Kapur, MD; Mariam Dollar, MD [address] [place], [place] [phone] M-F 8:00 - 5:00 Medicaid - call; Tricare - yes  [facility]- Cruz Condon, MD; [place], [place]; Delora Fuel, MD; Dareen Piano, MD; Trudee Grip, MD; Kizzie Ide, MD; Zebedee Iba; Birdena Crandall, MD; Hinton Dyer, MD; Parker, MD [address] [place], [place] [address] [phone] M-F 8:00am - 5:00pm; Sat. 9:00 - 11:00 Medicaid - yes; Tricare - yes  [facility] at [facility], MD [address], [place], [place] [address] [phone] M-F 8:00 - 5:00 Medicaid - [place] Medicaid only; Tricare - yes  [facility] - Illene Bolus, MD; Earlene Plater, [place]; Kenyon Ana, MD [address], [place], [place] [address] [phone] M-F 8:00 - 5:00 Medicaid - yes; Tricare - yes  [facility] [address] Pediatrics - Lind Covert, MD; Manson Passey, MD, [facility], MD, [place], MD; Makemie Park, MD; Katrinka Blazing, MD; [address] [place], [place] [address] [phone] M-F: 8-5 Medicaid - yes; Tricare - yes  [facility] - Henrietta Hoover, [place]; [place], MD; [address], [place], [place] [address] [phone] M-F 8-5 Medicaid - yes; Tricare - yes  [address] [place] Darrol Poke, MD; Tami Ribas, MD;  [place], MD; [place], DNP; [place], PNP [address], [place], [place] [address] [phone] M-F 8-5 Medicaid - yes; Tricare - yes  [facility] Delories Heinz, MD;  Adrienne Mocha; Ala Bent, MD [address] [place], [place] [address] [phone] M-F 7am - 5 pm Medicaid - call; Tricare - yes  [facility] [place], [place]; Fredia Beets, MD; Roxan Hockey, MD [address] [place], [place] [address] [phone] M-F 8-5 Medicaid - yes; Tricare - yes  [facility] Kae Heller, MD; Sheliah Hatch, MD; Mayford Knife, FNP; Shon Baton, FNP; Tyron Russell, FNP; Ishmael Holter; [facility] - FNP [address] [place], [place] [address] [phone] M-F 8-5 Medicaid- yes; Tricare - yes  [facility] - Betsy Coder, [place] and Chalmers Guest, MD; Terrial Rhodes, MD; Hulda Humphrey, MD; Roseanne Reno, MD; [place], [place]; Ala Dach, MD; Fredia Beets, MD; Dimple Casey, MD [address], [place], [place] [address] [phone] M-F: 8-5, Sat: 9-4, Sun 9-12 Medicaid - yes; Tricare - yes  [facility] [place], PNP; Earlene Plater, PNP [address] [place], [place] [address] [phone] M-F 8 - 5, closed 12-1 for lunch Medicaid - yes; Tricare - yes  [facility]s Kathyrn Lass, MD; Hal Neer, MD; Dimple Casey, MD; Pence, DO [address] [place], [place] [address] [phone] M-F 8- 5:30 Medicaid - yes; Tricare - yes  [facility] Pediatrics - Biagio Quint, MD; Rosalia Hammers, MD; Gwenith Daily, MD [address], [place], [place] [address] [phone] Judie Petit: Nicholas Lose; Tues-Fri: 8-5; Sat: 9-12 Medicaid - yes; Tricare - yes  [facility] Pediatrics - Bobbye Morton, MD; Daphane Shepherd, MD; Chestine Spore, MD; Haskell Riling, MD; Kate Sable, MD [address], [place], [place] [address] [phone] Judie PetitMarland Kitchen Nicholas LoseFrancee Nodal: 8-5; Sat: 8:30-12:30 Medicaid - yes; Tricare - yes  [facility]lena Heckle [facility]s - Beckey Rutter, MD; [place], [place] [address], [place], [place] [address] [phone] Mon-Fri: 8-5 Medicaid - yes;  Tricare - yes  [facility] - [place], CPNP; [place], [place]; Dimple Casey, MD; Alisa Graff, MD; Cephus Shelling, MD; [address], [place], [place] [address] [phone] M-F: 8-5, closed 1-2 for lunch Medicaid - yes; Tricare - yes  [facility] Pediatrics - [facility] [place], [place]; [place], [place]; Katrinka Blazing, MD; [place], CPNP; [place], [place]; [place], MD; Earlene Plater, MD [address], [place], [place] [address] [phone] M-Thurs: Nicholas Lose; Fri: 8-6; Sat: 9-12; Sun 2-4 Medicaid - yes; Tricare - yes  [facility] Georgeanna Lea, MD; Evette Cristal, MD; Shea Stakes, FNP; Earney Mallet, DO; [address] [place], [place] [address] [phone] M-F: 8-5 Medicaid - yes; Tricare - yes  [facility] Pediatric Providers  [facility] Family Medicine -Collene Mares, MD; [place], NP [address], [place], [place] [address] [phone] M - Fri: 8am - 5pm, closed for lunch 12-1 Medicaid - Yes; Tricare - yes  [facility] Elinor Parkinson, MD; Victory Dakin, MD; [place], DO; Vinocur, MD;Hall, PA; Clent Ridges, [place]; Orvan Falconer, NP [phone] [address] [place], [place] [address] [phone] M-F 8:00 - 5:00, Sat 8:00 - 11:30 Medicaid - yes; Tricare - yes  [facility] Welton Flakes, MD; [place], MD, [address], MD, [place], MD, [place], MD; [place]ak[place], NP; [place], [place];  [address], [place], [place] [address] [phone] M-F 8:10am - 5:00pm Medicaid - yes; Tricare - yes  [facility], MD; [place], NP [address] [place], [place] [address] [phone] M-F 8:00 - 5:00 Medicaid - [place] Medicaid only; Tricare - yes  [facility] - Deep [address] [place], [place]; [place], NP [address], [place], [place] [address] [phone] M-F 8:00 - 5:00; Closed for lunch 12 - 1:00 Medicaid - yes;  Tricare - yes  [facility] Belva Crome, MD; Jonita Albee, FNP [address] [place], [place] [address] [phone] Mon 9-5; Tues/Wed 10-5; Thurs 8:30-5; Fri: 8-12:30 Medicaid - yes; Tricare - yes  [facility] Pediatric Providers  [facility]  [place], MD; [place], [place] [address], [place], [place] [address] [phone] phone [phone] fax M-F 7:15 - 4:30 Medicaid - yes; Tricare - yes  [facility] Karilyn Cota, MD; [place] [address], [place], [place] [address] [phone] M-Fri: 8:30 - 5:00, closed for lunch everyday noon - 1pm Medicaid - Yes; Tricare - yes  [facility] Burdine, MD; Reuel Boom, MD; Dimas Aguas, MD; Neita Carp, MD; [place], [place]; Bonnita Nasuti, [place]; [place], [place]; [name], [place]; [place], [place] [address] [place], [place] [address] [phone] M-Thurs: 7:30am - 7:00pm; Friday 7:30am - 4pm; Sat: 8:00 - 1:00 Medicaid - Yes; Tricare - yes  [place] Norval Morton, MD; Conni Elliot, MD; Carroll Kinds, MD; Charco, DO [address], [place], [place] [address] [phone] M-Thur: 8:00 - 5:00, Fri: 8:00 - Noon Medicaid - yes; Tricare - yes No [place] [employer]  [place] [facility] Dettinger, MD; Nadine Counts, DO; [place], NP; Daphine Deutscher, NP; Lequita Halt, NP; Ellamae Sia, NP; Reginia Forts, NP; Darlyn Read, MD; [place], [place] [phone] [address] [place], [place] [address] [phone] M-F 8:00 - 5:00 Medicaid - yes; Tricare - yes  [facility], FNP-C; [place], FNP-C [address] [place], [place] [address] [phone] M, W, R 8:00-5:00, Tues: 8:00am - 7:00pm; Fri 8:00 - noon Medicaid - Yes; Tricare - yes  [facility], MD [address] [place], [place] [address] [phone]  M-Thurs 8:30-5:30, Fri: 8:30-12:30pm Medicaid - Yes; Tricare - N Birth Control Options Birth control is also called contraception. Birth control  prevents pregnancy. There are many types of birth control. Work with your health care provider to find the best option for you. Birth control that uses hormones These types of birth control have hormones in them to prevent pregnancy. Birth control implant This is a small tube that is put into the skin of your arm. The tube can stay in for up to 3 years. Birth control shot These are shots you get every 3 months. Birth control pills This is a pill you take every day. You need to take it at the same time each day. Birth control patch This is a patch that you put on your skin. You change it 1 time each week for 3 weeks. After that, you take the patch off for 1 week. Vaginal ring  This is a soft plastic ring that you put in your vagina. The ring is left in for 3 weeks. Then, you take it out for 1 week. Then, you put a new ring in. Barrier methods  Female condom This is a thin covering that you put on the penis before sex. The condom is thrown away after sex. Female condom This is a soft, loose covering that you put in the vagina before sex. The condom  is thrown away after sex. Diaphragm A diaphragm is a soft barrier that is shaped like a bowl. It must be made to fit your body. You put it in the vagina before sex with a chemical that kills sperm called spermicide. A diaphragm should be left in the vagina for 6-8 hours after sex and taken out within 24 hours. You need to replace a diaphragm: Every 1-2 years. After giving birth. After gaining more than 15 lb (6.8 kg). If you have surgery on your pelvis. Cervical cap This is a small, soft cup that fits over the cervix. The cervix is the lowest part of the uterus. It's put in the vagina before sex, along with spermicide. The cap must be made for you. The cap should be left in for 6-8 hours after sex. It is taken out within 48 hours. A cervical cap must be prescribed and fit to your body by a provider. It should be replaced every 2  years. Sponge This is a small sponge that is put into the vagina before sex. It must be left in for at least 6 hours after sex. It must be taken out within 30 hours and thrown away. Spermicides These are chemicals that kill or stop sperm from getting into the uterus. They may be a pill, cream, jelly, or foam that you put into your vagina. They should be used at least 10-15 minutes before sex. Intrauterine device An intrauterine device (IUD) is a device that's put in the uterus by a provider. There are two types: Hormone IUD. This kind can stay in for 3-5 years. Copper IUD. This kind can stay in for 10 years. Permanent birth control Female tubal ligation This is surgery to block the fallopian tubes. Hysteroscopic sterilization This is a procedure to put an insert into each fallopian tube. This method takes 3 months to work. Other forms of birth control must be used for 3 months. Female sterilization This is a surgery, called a vasectomy, to tie off the tubes that carry sperm in men. This method takes 3 months to work. Other forms of birth control must be used for 3 months. Natural planning methods This means not having sex on the days the female partner could get pregnant. Here are some types of natural planning birth control: Using a calendar: To keep track of the length of each menstrual cycle. To find out what days pregnancy can happen. To plan to not have sex on days when pregnancy can happen. Watching for signs of ovulation and not having sex during this time. The female partner can check for ovulation by keeping track of their temperature each day. They can also look for changes in the mucus that comes from the cervix. Where to find more information Centers for Disease Control and Prevention: [employer]er[employer].no This information is not intended to replace advice given to you by your health care provider. Make sure you discuss any questions you have with your health care provider. Document Revised:  06/11/2022 Document Reviewed: 06/11/2022 Elsevier Patient Education  2024 [employer].

## 2023-03-04 NOTE — Progress Notes (Signed)
ROB/GTT.  Pt declined FLU and TDAp Vaccines.

## 2023-03-04 NOTE — Progress Notes (Signed)
ROB GTT, CBC, HIV, RPR TDAP offered and ### FLU offered and ###

## 2023-03-05 DIAGNOSIS — O99013 Anemia complicating pregnancy, third trimester: Secondary | ICD-10-CM | POA: Insufficient documentation

## 2023-03-05 LAB — CBC
Hematocrit: 28.1 % — ABNORMAL LOW (ref 34.0–46.6)
Hemoglobin: 8.5 g/dL — ABNORMAL LOW (ref 11.1–15.9)
MCH: 22.5 pg — ABNORMAL LOW (ref 26.6–33.0)
MCHC: 30.2 g/dL — ABNORMAL LOW (ref 31.5–35.7)
MCV: 74 fL — ABNORMAL LOW (ref 79–97)
Platelets: 208 10*3/uL (ref 150–450)
RBC: 3.78 x10E6/uL (ref 3.77–5.28)
RDW: 25.2 % — ABNORMAL HIGH (ref 11.7–15.4)
WBC: 7.5 10*3/uL (ref 3.4–10.8)

## 2023-03-05 LAB — HIV ANTIBODY (ROUTINE TESTING W REFLEX): HIV Screen 4th Generation wRfx: NONREACTIVE

## 2023-03-05 LAB — RPR: RPR Ser Ql: NONREACTIVE

## 2023-03-05 LAB — GLUCOSE TOLERANCE, 2 HOURS W/ 1HR
Glucose, 1 hour: 82 mg/dL (ref 70–179)
Glucose, 2 hour: 103 mg/dL (ref 70–152)
Glucose, Fasting: 79 mg/dL (ref 70–91)

## 2023-03-07 ENCOUNTER — Encounter: Payer: Self-pay | Admitting: Obstetrics and Gynecology

## 2023-03-18 ENCOUNTER — Ambulatory Visit (INDEPENDENT_AMBULATORY_CARE_PROVIDER_SITE_OTHER): Payer: 59 | Admitting: Family Medicine

## 2023-03-18 VITALS — BP 122/90 | HR 77

## 2023-03-18 DIAGNOSIS — Z3A28 28 weeks gestation of pregnancy: Secondary | ICD-10-CM

## 2023-03-18 DIAGNOSIS — O99013 Anemia complicating pregnancy, third trimester: Secondary | ICD-10-CM

## 2023-03-18 DIAGNOSIS — Z3480 Encounter for supervision of other normal pregnancy, unspecified trimester: Secondary | ICD-10-CM

## 2023-03-18 DIAGNOSIS — B009 Herpesviral infection, unspecified: Secondary | ICD-10-CM

## 2023-03-18 NOTE — Progress Notes (Signed)
   PRENATAL VISIT NOTE  Subjective:  Jenny [name] is a 17 y.o. G1P0000 at [redacted]w[redacted]d being seen today for ongoing prenatal care.  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  Patient reports no complaints.  Contractions: Not present. Vag. Bleeding: None.  Movement: Present. Denies leaking of fluid.   The following portions of the patient's history were reviewed and updated as appropriate: allergies, current medications, past family history, past medical history, past social history, past surgical history and problem list.   Objective:   Vitals:   03/18/23 1011  BP: (!) 122/90  Pulse: 77    Fetal Status: Fetal Heart Rate (bpm): 140 Fundal Height: 28 cm Movement: Present     General:  Alert, oriented and cooperative. Patient is in no acute distress.  Skin: Skin is warm and dry. No rash noted.   Cardiovascular: Normal heart rate noted  Respiratory: Normal respiratory effort, no problems with respiration noted  Abdomen: Soft, gravid, appropriate for gestational age.  Pain/Pressure: Absent     Pelvic: Cervical exam deferred        Extremities: Normal range of motion.  Edema: None  Mental Status: Normal mood and affect. Normal behavior. Normal judgment and thought content.   Assessment and Plan:  Pregnancy: G1P0000 at [redacted]w[redacted]d 1. Supervision of other normal pregnancy, antepartum Continue routine prenatal care. Nml 2 hour  2. HSV-2 infection Will need ppx @ 34-36 weeks  3. Anemia in pregnancy, third trimester Iron deficiency--offered IV iron-she declines To add iron po M-W-F Repeat CBC @ 32 weeks, and if still low, consider IV iron  4. [redacted] weeks gestation of pregnancy   Preterm labor symptoms and general obstetric precautions including but not limited to vaginal bleeding, contractions, leaking of  fluid and fetal movement were reviewed in detail with the patient. Please refer to After Visit Summary for other counseling recommendations.   Return in 2 weeks (on 04/01/2023).  Future Appointments  Date Time Provider Department Center  04/01/2023 10:35 AM Joanne Gavel, MD [facility] None  04/15/2023  9:55 AM Warden Fillers, MD [facility] None  04/29/2023  9:55 AM Sue Lush, FNP [facility] None    Reva Bores, MD

## 2023-03-18 NOTE — Patient Instructions (Signed)
[email or web address] 

## 2023-04-01 ENCOUNTER — Encounter: Payer: 59 | Admitting: Obstetrics and Gynecology

## 2023-04-15 ENCOUNTER — Ambulatory Visit (INDEPENDENT_AMBULATORY_CARE_PROVIDER_SITE_OTHER): Payer: 59 | Admitting: Obstetrics and Gynecology

## 2023-04-15 VITALS — BP 117/78 | HR 84 | Wt 155.7 lb

## 2023-04-15 DIAGNOSIS — Z3A32 32 weeks gestation of pregnancy: Secondary | ICD-10-CM | POA: Diagnosis not present

## 2023-04-15 DIAGNOSIS — O99013 Anemia complicating pregnancy, third trimester: Secondary | ICD-10-CM

## 2023-04-15 DIAGNOSIS — B009 Herpesviral infection, unspecified: Secondary | ICD-10-CM

## 2023-04-15 DIAGNOSIS — Z3480 Encounter for supervision of other normal pregnancy, unspecified trimester: Secondary | ICD-10-CM

## 2023-04-15 NOTE — Progress Notes (Signed)
   PRENATAL VISIT NOTE  Subjective:  Jenny [name] is a 17 y.o. G1P0000 at [redacted]w[redacted]d being seen today for ongoing prenatal care.  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  Patient doing well with no acute concerns today. She reports no complaints.  Contractions: Not present. Vag. Bleeding: None.  Movement: Present. Denies leaking of fluid.   The following portions of the patient's history were reviewed and updated as appropriate: allergies, current medications, past family history, past medical history, past social history, past surgical history and problem list. Problem list updated.  Objective:   Vitals:   04/15/23 1014  BP: 117/78  Pulse: 84  Weight: 155 lb 11.2 oz (70.6 kg)    Fetal Status: Fetal Heart Rate (bpm): 140 Fundal Height: 32 cm Movement: Present     General:  Alert, oriented and cooperative. Patient is in no acute distress.  Skin: Skin is warm and dry. No rash noted.   Cardiovascular: Normal heart rate noted  Respiratory: Normal respiratory effort, no problems with respiration noted  Abdomen: Soft, gravid, appropriate for gestational age.  Pain/Pressure: Absent     Pelvic: Cervical exam deferred        Extremities: Normal range of motion.  Edema: None  Mental Status:  Normal mood and affect. Normal behavior. Normal judgment and thought content.   Assessment and Plan:  Pregnancy: G1P0000 at [redacted]w[redacted]d  1. [redacted] weeks gestation of pregnancy (Primary)   2. Anemia in pregnancy, third trimester Will recheck hgb today, if still low, would consider iron infusion - CBC  3. HSV-2 infection Prophylaxis at 36 weeks  4. Supervision of other normal pregnancy, antepartum Continue routine prenatal care  Preterm labor symptoms and general obstetric precautions including but not limited to  vaginal bleeding, contractions, leaking of fluid and fetal movement were reviewed in detail with the patient.  Please refer to After Visit Summary for other counseling recommendations.   Return in about 2 weeks (around 04/29/2023) for ROB, virtual.   Mariel Aloe, MD Faculty Attending Center for [facility]

## 2023-04-16 LAB — CBC
Hematocrit: 27.5 % — ABNORMAL LOW (ref 34.0–46.6)
Hemoglobin: 8.6 g/dL — ABNORMAL LOW (ref 11.1–15.9)
MCH: 23.4 pg — ABNORMAL LOW (ref 26.6–33.0)
MCHC: 31.3 g/dL — ABNORMAL LOW (ref 31.5–35.7)
MCV: 75 fL — ABNORMAL LOW (ref 79–97)
Platelets: 226 10*3/uL (ref 150–450)
RBC: 3.67 x10E6/uL — ABNORMAL LOW (ref 3.77–5.28)
RDW: 21.9 % — ABNORMAL HIGH (ref 11.7–15.4)
WBC: 6.5 10*3/uL (ref 3.4–10.8)

## 2023-04-17 ENCOUNTER — Telehealth: Payer: Self-pay | Admitting: Pharmacy Technician

## 2023-04-17 ENCOUNTER — Other Ambulatory Visit: Payer: Self-pay | Admitting: Obstetrics and Gynecology

## 2023-04-17 DIAGNOSIS — O99013 Anemia complicating pregnancy, third trimester: Secondary | ICD-10-CM

## 2023-04-17 NOTE — Telephone Encounter (Signed)
Dr. Donavan Foil, [place] Childes note:  Patient will be scheduled as soon as possible.  Auth Submission: NO AUTH NEEDED Site of care: Site of care: [facility] Payer: [employer] Medication & CPT/J Code(s) submitted: Venofer (Iron Sucrose) J1756 Route of submission (phone, fax, portal):  Phone # Fax # Auth type: Buy/Bill PB Units/visits requested: 2 Reference number:  Approval from: 04/17/23 to 05/30/23

## 2023-04-21 NOTE — Progress Notes (Signed)
Pt has iron infusion scheduled on 05/06/23 and will see provider prior to that on 04/29/23 for virtual OB appt.

## 2023-04-29 ENCOUNTER — Telehealth (INDEPENDENT_AMBULATORY_CARE_PROVIDER_SITE_OTHER): Payer: 59 | Admitting: Obstetrics and Gynecology

## 2023-04-29 DIAGNOSIS — Z3480 Encounter for supervision of other normal pregnancy, unspecified trimester: Secondary | ICD-10-CM

## 2023-04-29 DIAGNOSIS — Z3A34 34 weeks gestation of pregnancy: Secondary | ICD-10-CM

## 2023-04-29 DIAGNOSIS — O99013 Anemia complicating pregnancy, third trimester: Secondary | ICD-10-CM

## 2023-04-29 DIAGNOSIS — B009 Herpesviral infection, unspecified: Secondary | ICD-10-CM

## 2023-04-29 NOTE — Progress Notes (Signed)
  I connected with Jenny [name] 06/24/22 at  1:30 PM EST by: MyChart video and verified that I am speaking with the correct person using two identifiers.  Patient is located at home and provider is located at [facility].     I discussed the limitations, risks, security and privacy concerns of performing an evaluation and management service by MyChart video and the availability of in person appointments. I also discussed with the patient that there may be a patient responsible charge related to this service. By engaging in this virtual visit, you consent to the provision of healthcare.  Additionally, you authorize for your insurance to be billed for the services provided during this visit.  The patient expressed understanding and agreed to proceed.   The following staff members participated in the virtual visit:         PRENATAL VISIT NOTE  Subjective:  Jenny [name] is a 17 y.o. G1P0000 at [redacted]w[redacted]d being seen today for ongoing prenatal care.  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  Patient reports no complaints.  Contractions: Not present. Vag. Bleeding: None.  Movement: Present. Denies leaking of fluid.   The following portions of the patient's history were reviewed and updated as appropriate: allergies, current medications, past family history, past medical history, past social history, past surgical history and problem list.   Objective:  There were no vitals filed for this visit.  Fetal Status:     Movement: Present     General:  Alert, oriented and cooperative. Patient is in no acute distress.     Mental Status: Normal mood and affect. Normal behavior. Normal judgment and thought content.   Assessment and Plan:  Pregnancy: G1P0000 at [redacted]w[redacted]d 1. Supervision of other normal pregnancy, antepartum  (Primary) Doing well, feeling regular movement    2. Anemia in pregnancy, third trimester Has been taking oral iron , has iv iron  scheduled for 05/05/22  3. HSV-2 infection Had lesions 5/29 and came back HSV 2, no outbreak since then. Discussed valtrex  at 36 weeks for preventative   4. [redacted] weeks gestation of pregnancy Anticipatory guidance regarding swabs next visit  Preterm labor symptoms and general obstetric precautions including but not limited to vaginal bleeding, contractions, leaking of fluid and fetal movement were reviewed in detail with the patient. Please refer to After Visit Summary for other counseling recommendations.   Return in two weeks for routine prenatal   Future Appointments  Date Time Provider Department Center  05/06/2023  9:00 AM [facility] None  05/13/2023 10:35 AM Leftwich-Kirby, Olam LABOR, CNM [facility] None  05/19/2023  9:35 AM Leftwich-Kirby, Olam LABOR, CNM [facility] None  05/20/2023  9:00 AM [facility] None  05/26/2023  9:35 AM Constant, Winton, MD [facility] None  06/02/2023  9:35 AM Leftwich-Kirby, Olam LABOR, CNM [facility] None    Nidia Daring, FNP

## 2023-04-29 NOTE — Progress Notes (Signed)
Pt. Presents for rob. Phone visit. Pt. Was not able to weigh or take blood pressure and was not able to obtain fetal heart tones. Pt. Does not have any questions or concerns at this time.

## 2023-04-30 NOTE — L&D Delivery Note (Signed)
OB/GYN Faculty Practice Delivery Note  Jenny [name] is a 18 y.o. G1P0000 s/p SVD at [redacted]w[redacted]d. She was admitted for IOL 2/2 DFM.   ROM: 6h 50m with clear fluid GBS Status: Negative/-- (01/20 1025) Maximum Maternal Temperature: Temp (24hrs), Avg:98.6 F (37 C), Min:98.1 F (36.7 C), Max:98.8 F (37.1 C)   Labor Progress: Initial SVE: fingertip/50/-3. AROM, Pitocin, Cytotec, and IP Foley required. She then progressed to complete.   Delivery Date/Time: 06/01/23 2051 Delivery: Presented to room for cervical check. Babe found to be +4. Room quickly set up for delivery. FHT found to be in 70's, so pushing began immediately. Delivered within next contraction. Head delivered OA to ROA. nuchal x1 present which was delivered through and reduced via somersault maneuver . Shoulder and body delivered in usual fashion. Infant with spontaneous cry, placed on mother's abdomen, dried and stimulated. Cord clamped x 2 after +1-minute delay, and cut by patient's Aunt. Cord gases not obtained. Cord blood drawn. Placenta delivered spontaneously with gentle cord traction. Fundus firm with massage and Pitocin. Labia, perineum, and vagina inspected with  b/l periurethral/2nd degree perineal  which  were hemostatic and did not require repair/repaired with 3-0 Monocryl in the usual fashion . Mom and baby to postpartum. Baby Weight: pending  Placenta: 3 vessel, intact. Sent to L&D Complications: None Lacerations: b/l periurethral, 2nd degree EBL: 48 mL Anesthesia: epidural  Infant: baby girl Rh'yln APGAR (1 MIN): 8  APGAR (5 MINS): 9  APGAR (10 MINS):    Joanne Gavel, MD [facility] Fellow, [facility], [facility] 06/01/2023, 9:11 PM

## 2023-05-06 ENCOUNTER — Ambulatory Visit: Payer: 59

## 2023-05-07 ENCOUNTER — Telehealth: Payer: Self-pay

## 2023-05-07 ENCOUNTER — Other Ambulatory Visit: Payer: Self-pay

## 2023-05-07 MED ORDER — DOXYLAMINE-PYRIDOXINE 10-10 MG PO TBEC: 2.0000 | DELAYED_RELEASE_TABLET | Freq: Every day | ORAL | 2 refills | Status: DC

## 2023-05-07 NOTE — Telephone Encounter (Signed)
 Jenny [name]

## 2023-05-07 NOTE — Progress Notes (Signed)
 Returned call and pt stated that she has been experience nausea and vomiting, mostly at night and morning. Advised rx can be sent per protocol and if unable to keep down any food/fluids, be evaluated at [facility], pt agreed.

## 2023-05-10 ENCOUNTER — Encounter: Payer: Self-pay | Admitting: Obstetrics and Gynecology

## 2023-05-13 ENCOUNTER — Encounter: Payer: 59 | Admitting: Advanced Practice Midwife

## 2023-05-13 NOTE — Progress Notes (Deleted)
   PRENATAL VISIT NOTE  Subjective:  Jenny [name] is a 18 y.o. G1P0000 at [redacted]w[redacted]d being seen today for ongoing prenatal care.  She is currently monitored for the following issues for this {Blank single:19197::high-risk,low-risk} pregnancy and has HSV-2 infection; Supervision of other normal pregnancy, antepartum; and Anemia in pregnancy, third trimester on their problem list.  Patient reports {sx:14538}.   .  .   . Denies leaking of fluid.   The following portions of the patient's history were reviewed and updated as appropriate: allergies, current medications, past family history, past medical history, past social history, past surgical history and problem list.   Objective:  There were no vitals filed for this visit.  Fetal Status:           General:  Alert, oriented and cooperative. Patient is in no acute distress.  Skin: Skin is warm and dry. No rash noted.   Cardiovascular: Normal heart rate noted  Respiratory: Normal respiratory effort, no problems with respiration noted  Abdomen: Soft, gravid, appropriate for gestational age.        Pelvic: {Blank single:19197::Cervical exam performed in the presence of a chaperone,Cervical exam deferred}        Extremities: Normal range of motion.     Mental Status: Normal mood and affect. Normal behavior. Normal judgment and thought content.   Assessment and Plan:  Pregnancy: G1P0000 at [redacted]w[redacted]d 1. Supervision of other normal pregnancy, antepartum (Primary) ***  2. HSV-2 infection ***  3. Anemia in pregnancy, third trimester *** --Venofer  infusions ordered 12/17   {Blank single:19197::Term,Preterm} labor symptoms and general obstetric precautions including but not limited to vaginal bleeding, contractions, leaking of fluid and fetal movement were reviewed in detail with the patient. Please refer to After Visit Summary for other counseling recommendations.   No follow-ups on file.  Future Appointments  Date Time  Provider Department Center  05/13/2023 10:35 AM Leftwich-Kirby, Olam LABOR, CNM [facility] None  05/14/2023  9:00 AM [facility] None  05/19/2023  9:35 AM Leftwich-Kirby, Olam LABOR, CNM [facility] None  05/26/2023  9:35 AM Constant, Peggy, MD [facility] None  05/28/2023  9:00 AM [facility] None  06/02/2023  9:35 AM Leftwich-Kirby, Olam LABOR, CNM [facility] None  06/09/2023  9:55 AM Constant, Winton, MD [facility] None    Olam Boards, CNM

## 2023-05-14 ENCOUNTER — Ambulatory Visit (INDEPENDENT_AMBULATORY_CARE_PROVIDER_SITE_OTHER): Payer: Medicaid Other

## 2023-05-14 ENCOUNTER — Other Ambulatory Visit: Payer: Self-pay

## 2023-05-14 ENCOUNTER — Encounter (HOSPITAL_COMMUNITY): Payer: Self-pay

## 2023-05-14 ENCOUNTER — Emergency Department (HOSPITAL_COMMUNITY)
Admission: EM | Admit: 2023-05-14 | Discharge: 2023-05-14 | Disposition: A | Discharge status: Home / Self Care | Payer: 59 | Attending: Emergency Medicine | Admitting: Emergency Medicine

## 2023-05-14 VITALS — BP 148/104 | HR 55 | Temp 98.4°F | Resp 20 | Ht 64.5 in | Wt 159.2 lb

## 2023-05-14 DIAGNOSIS — Z3A36 36 weeks gestation of pregnancy: Secondary | ICD-10-CM

## 2023-05-14 DIAGNOSIS — O99013 Anemia complicating pregnancy, third trimester: Secondary | ICD-10-CM | POA: Diagnosis not present

## 2023-05-14 DIAGNOSIS — D508 Other iron deficiency anemias: Secondary | ICD-10-CM | POA: Diagnosis not present

## 2023-05-14 DIAGNOSIS — T7840XA Allergy, unspecified, initial encounter: Secondary | ICD-10-CM | POA: Diagnosis present

## 2023-05-14 DIAGNOSIS — T454X5A Adverse effect of iron and its compounds, initial encounter: Secondary | ICD-10-CM | POA: Diagnosis not present

## 2023-05-14 MED ORDER — EPINEPHRINE 0.3 MG/0.3ML IJ SOAJ: 0.3000 mg | INTRAMUSCULAR | 0 refills | Status: AC | PRN

## 2023-05-14 MED ORDER — DIPHENHYDRAMINE HCL 25 MG PO CAPS
25.0000 mg | ORAL_CAPSULE | Freq: Once | ORAL | Status: AC
Start: 2023-05-14 — End: 2023-05-14
  Administered 2023-05-14: 25 mg via ORAL
  Filled 2023-05-14: qty 1

## 2023-05-14 MED ORDER — ACETAMINOPHEN 325 MG PO TABS
650.0000 mg | ORAL_TABLET | Freq: Once | ORAL | Status: AC
  Administered 2023-05-14: 650 mg via ORAL
  Filled 2023-05-14: qty 2

## 2023-05-14 MED ORDER — EPINEPHRINE 0.3 MG/0.3ML IJ SOAJ: 0.3000 mg | Freq: Once | INTRAMUSCULAR | Status: DC | PRN

## 2023-05-14 MED ORDER — ALBUTEROL SULFATE HFA 108 (90 BASE) MCG/ACT IN AERS
2.0000 | INHALATION_SPRAY | Freq: Once | RESPIRATORY_TRACT | Status: DC | PRN
Start: 2023-05-14 — End: 2023-05-14

## 2023-05-14 MED ORDER — SODIUM CHLORIDE 0.9 % IV SOLN: Freq: Once | INTRAVENOUS | Status: AC | PRN

## 2023-05-14 MED ORDER — DIPHENHYDRAMINE HCL 50 MG/ML IJ SOLN
50.0000 mg | Freq: Once | INTRAMUSCULAR | Status: AC | PRN
  Administered 2023-05-14: 50 mg via INTRAVENOUS

## 2023-05-14 MED ORDER — IRON SUCROSE 500 MG IVPB - SIMPLE MED
500.0000 mg | Freq: Once | INTRAVENOUS | Status: AC
  Administered 2023-05-14: 500 mg via INTRAVENOUS

## 2023-05-14 MED ORDER — METHYLPREDNISOLONE SODIUM SUCC 125 MG IJ SOLR
125.0000 mg | Freq: Once | INTRAMUSCULAR | Status: AC | PRN
  Administered 2023-05-14: 125 mg via INTRAVENOUS

## 2023-05-14 MED ORDER — FAMOTIDINE IN NACL 20-0.9 MG/50ML-% IV SOLN
20.0000 mg | Freq: Once | INTRAVENOUS | Status: AC | PRN
  Administered 2023-05-14 (×2): 20 mg via INTRAVENOUS

## 2023-05-14 NOTE — ED Triage Notes (Signed)
 Patient was at infusion clinic receiving iron  infusion and at the end of the treatment patient started having allergic rreaction with hive, sob n/v and hypotension.  Patient was given solumedrol benadryl  and pepcid  at infusion center and Epi 0.3mg  IM with ems.  Patient appears with no hives or respiratory distress.  Patient currently on the phone with family member.

## 2023-05-14 NOTE — Progress Notes (Signed)
 Diagnosis: Iron  Deficiency Anemia  Provider:  Praveen Mannam MD  Procedure: IV Infusion  IV Type: Peripheral, IV Location: L Antecubital  Venofer  (Iron  Sucrose), Dose: 500 mg  Infusion Start Time: 1020  Infusion Stop Time: 1451  1451-Pump started beeping/Medication was completed. When I went to disconnect line, pt started to complain of pain and tightness in right hand/arm and I noticed raised hive like rash all over both arms. Pt states symptoms started 5 minutes prior but she did not call for a nurse.  1452-EMS called   1453-solumedrol 125mg  given IVP 1454-Benadryl  50mg  given IVP  1500-Dr. Leverette Read notified via secure chat. 1504-Pepcid  20mg  given IVBP 1505-EMS arrived and transported patient to ER.  Vitals stable throughout. Allergy added to med list. Next iron  infusion canceled per Dr. Racheal Buddle.   Post Infusion IV Care:  IV left in place. Pt transferred via EMS to ER.  Discharge: Condition: Stable, Destination: Emergency Room  . AVS Declined  Performed by:  Star East, LPN

## 2023-05-14 NOTE — ED Provider Notes (Signed)
 [facility] AT [facility] Provider Note   CSN: [id number] Arrival date & time: 05/14/23  1550     History  Chief Complaint  Patient presents with   Allergic Reaction    Jenny [name] is a 18 y.o. female who presents with concern for an allergic reaction that occurred at her iron  infusion.  She reports that the nursing staff noted some rash and swelling in her arms and feet, and she began to feel nauseous.  She was given IM epi, Benadryl , Pepcid , Solu-Medrol .  States this occurred at approximately 3:45 PM today.  Currently feels at baseline aside from some slight nausea.  Denies any shortness of breath, difficulty breathing, feelings of throat closure.    Allergic Reaction Presenting symptoms: rash        Home Medications Prior to Admission medications   Medication Sig Start Date End Date Taking? Authorizing Provider  EPINEPHrine  0.3 mg/0.3 mL IJ SOAJ injection Inject 0.3 mg into the muscle as needed for anaphylaxis. 05/14/23  Yes Rexie Catena, PA-C  Blood Pressure Monitoring (BLOOD PRESSURE KIT) DEVI 1 Device by Does not apply route once a week. Patient not taking: Reported on 03/18/2023 01/08/23   Abigail Abler, MD  Doxylamine -Pyridoxine  (DICLEGIS ) 10-10 MG TBEC Take 2 tablets by mouth at bedtime. May add 1 tablet at breakfast and 1 tablet at lunch if needed 05/07/23   Abigail Abler, MD  ferrous sulfate  325 (65 FE) MG tablet Take 1 tablet (325 mg total) by mouth every other day. 02/04/23   Tresia Fruit, MD  Prenatal Vit-Fe Fumarate-FA (PRENATAL VITAMIN) 27-0.8 MG TABS Take 1 tablet by mouth daily. 02/03/23   Teena Feast, MD      Allergies    Iron  dextran, Venofer  [iron  sucrose], and Watermelon [citrullus vulgaris]    Review of Systems   Review of Systems  Skin:  Positive for rash.    Physical Exam Updated Vital Signs BP (!) 117/51 (BP Location: Right Arm)   Pulse (!) 103   Temp 98 F (36.7 C) (Oral)   Resp 16    Ht 5' 4.5" (1.638 m)   Wt 72.1 kg   LMP 09/01/2022   SpO2 100%   BMI 26.87 kg/m  Physical Exam Vitals and nursing note reviewed.  Constitutional:      General: She is not in acute distress.    Appearance: She is well-developed.     Comments: Well-appearing, not actively vomiting  HENT:     Head: Normocephalic and atraumatic.     Mouth/Throat:     Comments: Tongue, lips, posterior oropharynx without any edema Eyes:     Conjunctiva/sclera: Conjunctivae normal.  Cardiovascular:     Rate and Rhythm: Normal rate and regular rhythm.     Heart sounds: No murmur heard. Pulmonary:     Effort: Pulmonary effort is normal. No respiratory distress.     Breath sounds: Normal breath sounds.     Comments: No stridor Breathing comfortably on room air, talking in full sentences Abdominal:     Comments: Gravid belly  Musculoskeletal:        General: No swelling.     Cervical back: Neck supple.  Skin:    General: Skin is warm and dry.     Capillary Refill: Capillary refill takes less than 2 seconds.     Comments: No rash present  Neurological:     Mental Status: She is alert.  Psychiatric:  Mood and Affect: Mood normal.     ED Results / Procedures / Treatments   Labs (all labs ordered are listed, but only abnormal results are displayed) Labs Reviewed - No data to display  EKG None  Radiology No results found.  Procedures Procedures    Medications Ordered in ED Medications - No data to display  ED Course/ Medical Decision Making/ A&P                                 Medical Decision Making    Differential diagnosis includes but is not limited to allergic reaction, anaphylaxis  ED Course:  Patient well-appearing, no acute distress.  No signs of anaphylaxis currently-no hypotension, difficulty breathing, edema, rash.  Patient does report feeling slightly nauseous.  On review of LPN note from earlier today, she did receive 50mg  Benadryl , 20 mg Pepcid , 125mg   Solu-Medrol , and epi before arrival.  No indications to give further medication.  The allergic reaction occurred at approximately 3:45 PM today.  Given resolution of symptoms, likely will not require prolonged period of observation or admission.  Will continue to monitor to ensure symptoms remain resolved.  Upon re-evaluation at 5:10 PM, patient sleepy but well-appearing.  States she feels at her baseline.  Denies any return of symptoms.  Stable and appropriate for discharge home this time.  Impression: Allergic reaction, resolved  Disposition:  The patient was discharged home with instructions to use EpiPen  prescribed if she develops any hives, throat swelling, difficulty breathing, or other allergic reaction symptoms in the future. Discuss with her iron  infusion clinic best way to proceed for further treatments. Return precautions given.              Final Clinical Impression(s) / ED Diagnoses Final diagnoses:  Allergic reaction, initial encounter    Rx / DC Orders ED Discharge Orders          Ordered    EPINEPHrine  0.3 mg/0.3 mL IJ SOAJ injection  As needed        05/14/23 1715              Rexie Catena, PA-C 05/14/23 1717    Mozell Arias, MD 05/14/23 2328

## 2023-05-14 NOTE — Discharge Instructions (Addendum)
 If you continue to have any itching or rash at home, you may use 25mg  Benadryl  every 4-6 hours as needed.  You have been prescribed an EpiPen  to use at home if you have an allergic reaction the future that causes severe dizziness, vomiting, hives, or difficulty breathing.  If you use the EpiPen , you must come to the ER immediately after for evaluation and further treatment.  Return the ER immediately if you have any feelings of throat closure, difficulty breathing, vomiting, dizziness, any other new or concerning symptoms.

## 2023-05-19 ENCOUNTER — Ambulatory Visit (INDEPENDENT_AMBULATORY_CARE_PROVIDER_SITE_OTHER): Payer: Medicaid Other | Admitting: Advanced Practice Midwife

## 2023-05-19 ENCOUNTER — Other Ambulatory Visit (HOSPITAL_COMMUNITY)
Admission: RE | Admit: 2023-05-19 | Discharge: 2023-05-19 | Disposition: A | Discharge status: Home / Self Care | Payer: Medicaid Other | Source: Ambulatory Visit | Attending: Advanced Practice Midwife | Admitting: Advanced Practice Midwife

## 2023-05-19 VITALS — BP 127/94 | HR 85 | Wt 163.2 lb

## 2023-05-19 DIAGNOSIS — Z3A37 37 weeks gestation of pregnancy: Secondary | ICD-10-CM | POA: Insufficient documentation

## 2023-05-19 DIAGNOSIS — Z3480 Encounter for supervision of other normal pregnancy, unspecified trimester: Secondary | ICD-10-CM

## 2023-05-19 DIAGNOSIS — O99013 Anemia complicating pregnancy, third trimester: Secondary | ICD-10-CM | POA: Diagnosis not present

## 2023-05-19 DIAGNOSIS — B009 Herpesviral infection, unspecified: Secondary | ICD-10-CM | POA: Diagnosis not present

## 2023-05-19 NOTE — Progress Notes (Signed)
   PRENATAL VISIT NOTE  Subjective:  Jenny [name] is a 18 y.o. G1P0000 at [redacted]w[redacted]d being seen today for ongoing prenatal care.  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  Patient reports no complaints.  Contractions: Not present. Vag. Bleeding: None.  Movement: Present. Denies leaking of fluid.   The following portions of the patient's history were reviewed and updated as appropriate: allergies, current medications, past family history, past medical history, past social history, past surgical history and problem list.   Objective:   Vitals:   05/19/23 0947  BP: (!) 127/94  Pulse: 85  Weight: 163 lb 3.2 oz (74 kg)    Fetal Status: Fetal Heart Rate (bpm): 144   Movement: Present    FH 37  General:  Alert, oriented and cooperative. Patient is in no acute distress.  Skin: Skin is warm and dry. No rash noted.   Cardiovascular: Normal heart rate noted  Respiratory: Normal respiratory effort, no problems with respiration noted  Abdomen: Soft, gravid, appropriate for gestational age.  Pain/Pressure: Absent     Pelvic: Cervical exam deferred        Extremities: Normal range of motion.  Edema: None  Mental Status: Normal mood and affect. Normal behavior. Normal judgment and thought content.   Assessment and Plan:  Pregnancy: G1P0000 at [redacted]w[redacted]d There are no diagnoses linked to this encounter. Term labor symptoms and general obstetric precautions including but not limited to vaginal bleeding, contractions, leaking of fluid and fetal movement were reviewed in detail with the patient. Please refer to After Visit Summary for other counseling recommendations.    Future Appointments  Date Time Provider Department Center  05/26/2023  9:35 AM Constant, Gigi Gin, MD [facility] None  05/28/2023  9:00 AM [facility] 8  [facility] None  06/02/2023  9:35 AM Leftwich-Kirby, Wilmer Floor, CNM [facility] None  06/09/2023  9:55 AM Constant, Gigi Gin, MD [facility] None    1. Anemia in pregnancy, third trimester (Primary) - CBC - Taking Ferrous Sulfate every other day - Had reaction to 1/15 iron infusion (prefers oral supplements only)  2. HSV-2 infection Valtrex Started on suppression daily therapy on 07/15/2022   3. Supervision of other normal pregnancy, antepartum   4. [redacted] weeks gestation of pregnancy - Culture, beta strep (group b only) - Cervicovaginal ancillary only( [place])   Marcell Barlow, MSN, [facility], [facility] [employer]

## 2023-05-19 NOTE — Progress Notes (Signed)
Had reaction to iron supplements, was given benadryl and epi pen 05/14/2023. Due for another supplement, has questions about that. Plan GBS and swab today. Not done last visit.

## 2023-05-20 ENCOUNTER — Ambulatory Visit: Payer: 59

## 2023-05-20 LAB — CERVICOVAGINAL ANCILLARY ONLY
Bacterial Vaginitis (gardnerella): POSITIVE — AB
Candida Glabrata: NEGATIVE
Candida Vaginitis: POSITIVE — AB
Chlamydia: NEGATIVE
Comment: NEGATIVE
Comment: NEGATIVE
Comment: NEGATIVE
Comment: NEGATIVE
Comment: NEGATIVE
Comment: NORMAL
Neisseria Gonorrhea: NEGATIVE
Trichomonas: NEGATIVE

## 2023-05-21 ENCOUNTER — Other Ambulatory Visit: Payer: Self-pay

## 2023-05-21 ENCOUNTER — Other Ambulatory Visit: Payer: Self-pay | Admitting: Nurse Practitioner

## 2023-05-21 ENCOUNTER — Encounter: Payer: Self-pay | Admitting: Nurse Practitioner

## 2023-05-21 MED ORDER — METRONIDAZOLE 0.75 % VA GEL: 1.0000 | Freq: Every day | VAGINAL | 1 refills | Status: DC

## 2023-05-21 MED ORDER — NYSTATIN 100000 UNIT/GM EX CREA: 1.0000 | TOPICAL_CREAM | Freq: Two times a day (BID) | CUTANEOUS | 0 refills | Status: DC

## 2023-05-21 MED ORDER — METRONIDAZOLE 500 MG PO TABS: 500.0000 mg | ORAL_TABLET | Freq: Two times a day (BID) | ORAL | 0 refills | Status: DC

## 2023-05-21 NOTE — Progress Notes (Signed)
BV- RX sent Yeast- RX sent

## 2023-05-21 NOTE — Progress Notes (Signed)
RX sent for metrogel per pt request, pt does not want to take flagyl pills for BV

## 2023-05-23 LAB — CULTURE, BETA STREP (GROUP B ONLY): Strep Gp B Culture: NEGATIVE

## 2023-05-26 ENCOUNTER — Encounter: Payer: 59 | Admitting: Nurse Practitioner

## 2023-05-28 ENCOUNTER — Ambulatory Visit: Payer: 59

## 2023-05-31 ENCOUNTER — Inpatient Hospital Stay (HOSPITAL_COMMUNITY)
Admission: AD | Admit: 2023-05-31 | Discharge: 2023-06-03 | DRG: 806 | Disposition: A | Discharge status: Home / Self Care | Payer: Medicaid Other | Attending: Obstetrics & Gynecology | Admitting: Obstetrics & Gynecology

## 2023-05-31 ENCOUNTER — Encounter (HOSPITAL_COMMUNITY): Payer: Self-pay | Admitting: Family Medicine

## 2023-05-31 ENCOUNTER — Other Ambulatory Visit: Payer: Self-pay

## 2023-05-31 DIAGNOSIS — Z3480 Encounter for supervision of other normal pregnancy, unspecified trimester: Secondary | ICD-10-CM

## 2023-05-31 DIAGNOSIS — O9832 Other infections with a predominantly sexual mode of transmission complicating childbirth: Secondary | ICD-10-CM | POA: Diagnosis present

## 2023-05-31 DIAGNOSIS — O99013 Anemia complicating pregnancy, third trimester: Secondary | ICD-10-CM | POA: Diagnosis present

## 2023-05-31 DIAGNOSIS — Z8249 Family history of ischemic heart disease and other diseases of the circulatory system: Secondary | ICD-10-CM | POA: Diagnosis not present

## 2023-05-31 DIAGNOSIS — O26893 Other specified pregnancy related conditions, third trimester: Secondary | ICD-10-CM | POA: Diagnosis present

## 2023-05-31 DIAGNOSIS — O09613 Supervision of young primigravida, third trimester: Secondary | ICD-10-CM | POA: Diagnosis not present

## 2023-05-31 DIAGNOSIS — Z3A38 38 weeks gestation of pregnancy: Secondary | ICD-10-CM | POA: Diagnosis not present

## 2023-05-31 DIAGNOSIS — Z888 Allergy status to other drugs, medicaments and biological substances status: Secondary | ICD-10-CM | POA: Diagnosis not present

## 2023-05-31 DIAGNOSIS — O368130 Decreased fetal movements, third trimester, not applicable or unspecified: Principal | ICD-10-CM | POA: Diagnosis present

## 2023-05-31 DIAGNOSIS — O9902 Anemia complicating childbirth: Secondary | ICD-10-CM | POA: Diagnosis present

## 2023-05-31 DIAGNOSIS — A6000 Herpesviral infection of urogenital system, unspecified: Secondary | ICD-10-CM | POA: Diagnosis present

## 2023-05-31 DIAGNOSIS — B009 Herpesviral infection, unspecified: Secondary | ICD-10-CM | POA: Diagnosis present

## 2023-05-31 LAB — COMPREHENSIVE METABOLIC PANEL
ALT: 13 U/L (ref 0–44)
AST: 31 U/L (ref 15–41)
Albumin: 3.2 g/dL — ABNORMAL LOW (ref 3.5–5.0)
Alkaline Phosphatase: 218 U/L — ABNORMAL HIGH (ref 38–126)
Anion gap: 11 (ref 5–15)
BUN: 7 mg/dL (ref 6–20)
CO2: 21 mmol/L — ABNORMAL LOW (ref 22–32)
Calcium: 9.2 mg/dL (ref 8.9–10.3)
Chloride: 103 mmol/L (ref 98–111)
Creatinine, Ser: 0.64 mg/dL (ref 0.44–1.00)
GFR, Estimated: >60 mL/min (ref >60)
Glucose, Bld: 82 mg/dL (ref 70–99)
Potassium: 4.1 mmol/L (ref 3.5–5.1)
Sodium: 135 mmol/L (ref 135–145)
Total Bilirubin: 0.5 mg/dL (ref 0.0–1.2)
Total Protein: 6.7 g/dL (ref 6.5–8.1)

## 2023-05-31 LAB — TYPE AND SCREEN
ABO/RH(D): O POS
Antibody Screen: NEGATIVE

## 2023-05-31 LAB — CBC
HCT: 39.1 % (ref 36.0–46.0)
Hemoglobin: 12.7 g/dL (ref 12.0–15.0)
MCH: 26.7 pg (ref 26.0–34.0)
MCHC: 32.5 g/dL (ref 30.0–36.0)
MCV: 82.1 fL (ref 80.0–100.0)
Platelets: 158 10*3/uL (ref 150–400)
RBC: 4.76 MIL/uL (ref 3.87–5.11)
RDW: 23.2 % — ABNORMAL HIGH (ref 11.5–15.5)
WBC: 6.9 10*3/uL (ref 4.0–10.5)
nRBC: 0 % (ref 0.0–0.2)

## 2023-05-31 MED ORDER — SOD CITRATE-CITRIC ACID 500-334 MG/5ML PO SOLN: 30.0000 mL | ORAL | Status: DC | PRN

## 2023-05-31 MED ORDER — OXYTOCIN-SODIUM CHLORIDE 30-0.9 UT/500ML-% IV SOLN
2.5000 [IU]/h | INTRAVENOUS | Status: DC
  Administered 2023-06-01: 2.5 [IU]/h via INTRAVENOUS

## 2023-05-31 MED ORDER — OXYCODONE-ACETAMINOPHEN 5-325 MG PO TABS: 2.0000 | ORAL_TABLET | ORAL | Status: DC | PRN

## 2023-05-31 MED ORDER — FLEET ENEMA RE ENEM: 1.0000 | ENEMA | RECTAL | Status: DC | PRN

## 2023-05-31 MED ORDER — OXYTOCIN BOLUS FROM INFUSION
333.0000 mL | Freq: Once | INTRAVENOUS | Status: AC
  Administered 2023-06-01: 333 mL via INTRAVENOUS

## 2023-05-31 MED ORDER — MISOPROSTOL 50MCG HALF TABLET
50.0000 ug | ORAL_TABLET | Freq: Once | ORAL | Status: AC
  Administered 2023-05-31: 50 ug via ORAL
  Filled 2023-05-31: qty 1

## 2023-05-31 MED ORDER — TERBUTALINE SULFATE 1 MG/ML IJ SOLN
0.2500 mg | Freq: Once | INTRAMUSCULAR | Status: AC | PRN
Start: 2023-05-31 — End: 2023-06-01
  Administered 2023-06-01: 0.25 mg via SUBCUTANEOUS
  Filled 2023-05-31: qty 1

## 2023-05-31 MED ORDER — OXYCODONE-ACETAMINOPHEN 5-325 MG PO TABS: 1.0000 | ORAL_TABLET | ORAL | Status: DC | PRN

## 2023-05-31 MED ORDER — LACTATED RINGERS IV BOLUS: 1000.0000 mL | Freq: Once | INTRAVENOUS | Status: DC

## 2023-05-31 MED ORDER — LACTATED RINGERS IV SOLN: INTRAVENOUS | Status: DC

## 2023-05-31 MED ORDER — LIDOCAINE HCL (PF) 1 % IJ SOLN
30.0000 mL | INTRAMUSCULAR | Status: DC | PRN
Start: 2023-05-31 — End: 2023-06-01

## 2023-05-31 MED ORDER — LACTATED RINGERS IV SOLN
500.0000 mL | INTRAVENOUS | Status: DC | PRN
Start: 2023-05-31 — End: 2023-06-01
  Administered 2023-06-01 (×2): 500 mL via INTRAVENOUS

## 2023-05-31 MED ORDER — MISOPROSTOL 25 MCG QUARTER TABLET
25.0000 ug | ORAL_TABLET | Freq: Once | ORAL | Status: DC
  Filled 2023-05-31 (×2): qty 1

## 2023-05-31 MED ORDER — ONDANSETRON HCL 4 MG/2ML IJ SOLN: 4.0000 mg | Freq: Four times a day (QID) | INTRAMUSCULAR | Status: DC | PRN

## 2023-05-31 MED ORDER — ACETAMINOPHEN 325 MG PO TABS: 650.0000 mg | ORAL_TABLET | ORAL | Status: DC | PRN

## 2023-05-31 MED ORDER — FENTANYL CITRATE (PF) 100 MCG/2ML IJ SOLN
50.0000 ug | INTRAMUSCULAR | Status: DC | PRN
  Administered 2023-06-01: 100 ug via INTRAVENOUS
  Filled 2023-05-31 (×3): qty 2

## 2023-05-31 NOTE — H&P (Signed)
OBSTETRIC ADMISSION HISTORY AND PHYSICAL  Araminta Zorn is a 18 y.o. female G1P0000 with IUP at [redacted]w[redacted]d by LMP c/w 9wk U/S presenting for DFM. She reports +FMs, No LOF, no VB, no blurry vision, headaches or peripheral edema, and RUQ pain.  She plans on formula and breast feeding. She requests condoms for birth control until her postpartum visit. Specifically declines depo, Nexplanon and IUD immediately postpartum. She received her prenatal care at [facility]  Dating: By LMP (09/01/22) c/w 9wk U/S --->  Estimated Date of Delivery: 06/08/23  Sono: @[redacted]w[redacted]d , CWD, normal anatomy, cephalic presentation, anterior placenta, 883g, 60%ile   Prenatal History/Complications: HSV, declined suppressive therapy  NURSING  PROVIDER  Office Location [facility] Dating by LMP c/w U/S at 9 wks  [facility] Model Traditional Anatomy U/S normal  Initiated care at  [facility]  English              LAB RESULTS   Support Person  FOB Genetics NIPS: declined AFP: declined    NT/IT (FT only)     Carrier Screen Horizon: declined  Rhogam  O/Positive/-- (07/29 0000) A1C/GTT Early:  Third trimester: nl 2 hour  Flu Vaccine  DECLINED 03/04/23    TDaP Vaccine  DECLINED 03/04/23 Blood Type O/Positive/-- (07/29 0000)  Covid Vaccine  Yes Antibody Negative (07/29 0000)  RSV Vaccine  Rubella Immune (07/29 0000)  Feeding Plan  BOTTLE RPR Nonreactive (07/29 0000)  Contraception Condoms HBsAg Negative (07/29 0000)  Circumcision It's a Girl.  HIV Non-reactive (07/29 0000)  Pediatrician   UNDECIDED HCVAb Negative (07/29 0000)  Prenatal Classes       Pap No results found for: "DIAGPAP"  BTLConsent  GC/CT Initial:   36wks:    VBAC  Consent  GBS   For PCN allergy, check sensitivities        DME Rx [X]  BP cuff [ ]  Weight Scale Waterbirth  [ ]  Class [ ]  Consent [ ]  CNM visit  PHQ9 & GAD7 [X]  new OB [X]  28 weeks  [  ] 36 weeks Induction  [ ]  Orders Entered [ ] Foley Y/N    Past Medical History: Past Medical  History:  Diagnosis Date   Anemia    BMI (body mass index), pediatric, 5% to less than 85% for age 07/08/2014   Encounter for routine child health examination without abnormal findings 02/07/2015   Headache    Vulvar pain 09/25/2021   Past Surgical History: Past Surgical History:  Procedure Laterality Date   NO PAST SURGERIES     Obstetrical History: OB History     Gravida  1   Para  0   Term  0   Preterm  0   AB  0   Living  0      SAB  0   IAB  0   Ectopic  0   Multiple  0   Live Births  0          Social History Social History   Socioeconomic History   Marital status: Single    Spouse name: Not on file   Number of children: Not on file   Years of education: Not on file   Highest education level: Not on file  Occupational History   Not on file  Tobacco Use   Smoking status: Never    Passive exposure: Yes   Smokeless tobacco: Never   Tobacco comments:  vape  Vaping Use   Vaping status: Former   Quit date: 09/27/2021  Substance and Sexual Activity   Alcohol use: Never   Drug use: Never   Sexual activity: Yes    Birth control/protection: None  Other Topics Concern   Not on file  Social History Narrative   11th grade at [employer]   Social Drivers of Health   Financial Resource Strain: Not on file  Food Insecurity: Not on file  Transportation Needs: Not on file  Physical Activity: Not on file  Stress: Not on file  Social Connections: Not on file   Family History: Family History  Problem Relation Age of Onset   Hypertension Father    Asthma Paternal Grandmother    Hypertension Paternal Grandmother    Cancer Paternal Grandfather    Hypertension Paternal Grandfather    Stroke Paternal Grandfather    Alcohol abuse Neg Hx    Arthritis Neg Hx    Birth defects Neg Hx    COPD Neg Hx    Depression Neg Hx    Diabetes Neg Hx    Drug abuse Neg Hx    Early death Neg Hx    Hearing loss Neg Hx    Heart disease Neg Hx     Hyperlipidemia Neg Hx    Learning disabilities Neg Hx    Kidney disease Neg Hx    Mental illness Neg Hx    Mental retardation Neg Hx    Miscarriages / Stillbirths Neg Hx    Vision loss Neg Hx    Varicose Veins Neg Hx    Allergies: Allergies  Allergen Reactions   Iron Dextran Anaphylaxis   Venofer [Iron Sucrose] Hives   Watermelon [Citrullus Vulgaris] Other (See Comments)    Mouth starts burning (patient reported)   Medications Prior to Admission  Medication Sig Dispense Refill Last Dose/Taking   metroNIDAZOLE (METROGEL) 0.75 % vaginal gel Place 1 Applicatorful vaginally at bedtime. Apply one applicatorful to vagina at bedtime for 5 days 70 g 1 05/31/2023   nystatin cream (MYCOSTATIN) Apply 1 Application topically 2 (two) times daily. 30 g 0 05/31/2023   Prenatal Vit-Fe Fumarate-FA (PRENATAL VITAMIN) 27-0.8 MG TABS Take 1 tablet by mouth daily. 30 tablet 11 05/31/2023   Blood Pressure Monitoring (BLOOD PRESSURE KIT) DEVI 1 Device by Does not apply route once a week. (Patient not taking: Reported on 03/18/2023) 1 each 0    Doxylamine-Pyridoxine (DICLEGIS) 10-10 MG TBEC Take 2 tablets by mouth at bedtime. May add 1 tablet at breakfast and 1 tablet at lunch if needed (Patient not taking: Reported on 05/19/2023) 100 tablet 2    EPINEPHrine 0.3 mg/0.3 mL IJ SOAJ injection Inject 0.3 mg into the muscle as needed for anaphylaxis. (Patient not taking: Reported on 05/19/2023) 1 each 0    ferrous sulfate 325 (65 FE) MG tablet Take 1 tablet (325 mg total) by mouth every other day. 30 tablet 3    Review of Systems  All systems reviewed and negative except as stated in HPI  Blood pressure (!) 134/97, pulse 81, temperature 99.1 F (37.3 C), resp. rate 16, height 5\' 4"  (1.626 m), weight 164 lb 14.4 oz (74.8 kg), last menstrual period 09/01/2022, SpO2 99%. General appearance: alert, cooperative, appears stated age, and no distress Lungs: clear to auscultation bilaterally Heart: regular rate and  rhythm Abdomen: soft, non-tender; bowel sounds normal Pelvic: normal external female genitalia, discoloration from previous outbreak, but no active lesion externally or internally on speculum exam Extremities:  Homans sign is negative, no sign of DVT Presentation: cephalic Fetal monitoring - Baseline: 140 bpm, Variability: Good {> 6 bpm), Accelerations: none, and Decelerations: Absent Uterine activity - not feeling contractions but noted q43min on monitor  Dilation: Fingertip Effacement (%): 50 Cervical Position: Posterior Station: -3 Presentation: Undeterminable Exam by:: Richard RNC   Prenatal labs: ABO, Rh: O/Positive/-- (07/29 0000) Antibody: Negative (07/29 0000) Rubella: Immune (07/29 0000) RPR: Non Reactive (11/05 0901)  HBsAg: Negative (07/29 0000)  HIV: Non Reactive (11/05 0901)  GBS: Negative/-- (01/20 1025)    Lab Results  Component Value Date   GBS Negative 05/19/2023   Immunization History  Administered Date(s) Administered   DTaP 10/05/2003, 12/05/2003, 02/15/2004, 10/03/2004, 12/01/2007   HIB (PRP-OMP) 10/05/2003, 12/05/2003, 07/16/2004   Hepatitis A 07/16/2004, 01/16/2005   Hepatitis B 12/20/03, 12/05/2003, 02/15/2004   IPV 10/05/2003, 12/05/2003, 07/16/2004, 12/01/2007   MMR 07/16/2004, 12/01/2007   MenQuadfi_Meningococcal Groups ACYW Conjugate 07/13/2020   Meningococcal Conjugate 02/07/2015   Pneumococcal Conjugate-13 10/05/2003, 12/05/2003, 02/15/2004, 07/16/2004   Tdap 12/13/2014   Varicella 07/16/2004, 11/29/2008   Prenatal Transfer Tool  Maternal Diabetes: No Genetic Screening: Declined Maternal Ultrasounds/Referrals: Normal Fetal Ultrasounds or other Referrals:  Referred to Materal Fetal Medicine  Maternal Substance Abuse:  No Significant Maternal Medications:  None Significant Maternal Lab Results: None and Group B Strep negative Number of Prenatal Visits:greater than 3 verified prenatal visits Maternal Vaccinations: Declined all maternal  vaccinations Other Comments:  None  No results found for this or any previous visit (from the past 24 hours).  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 G1P0000 at [redacted]w[redacted]d here for IOL for decreased fetal movement with NRNST (good variability but no accelerations in MAU)  #Labor: Cytotec ordered #Pain: Desires epidural #FWB: Cat 2, bolus ordered, labor provider aware #GBS status:  negative #Feeding: Breastmilk  and Formula #Reproductive Life planning: Condoms #Circ:  not applicable #HSV: no suppression, no lesions noted on speculum exam (discoloration on vulva but painless and not ulcerated/excoriated)  Bernerd Limbo, CNM  05/31/2023, 9:56 PM

## 2023-05-31 NOTE — MAU Note (Addendum)
..  Jenny [name] is a 18 y.o. at [redacted]w[redacted]d here in MAU reporting: Decreased fetal movement all day, last movement was 30 minutes ago. Explains that usually baby moves all day but today she has only had some flutters.  Denies vaginal bleeding or leaking of fluid.  Reports a few contractions every day, but none that are regular.   Pain score: 6/10 Vitals:   05/31/23 2019  BP: 128/89  Pulse: 86  Resp: 17  Temp: 99.1 F (37.3 C)  SpO2: 100%     FHT:150 Lab orders placed from triage:  none

## 2023-05-31 NOTE — Progress Notes (Signed)
Confirmed vertex via BSUS. Counseled on POC, including cytotec placement, consideration of foley balloon placement and AROM when appropriate. Support person at bedside.   Lamont Snowball, MSN, CNM, RNC-OB Certified Nurse Midwife, [facility] 05/31/2023 11:21 PM

## 2023-06-01 ENCOUNTER — Inpatient Hospital Stay (HOSPITAL_COMMUNITY): Payer: Medicaid Other | Admitting: Anesthesiology

## 2023-06-01 ENCOUNTER — Encounter (HOSPITAL_COMMUNITY): Payer: Self-pay | Admitting: Family Medicine

## 2023-06-01 DIAGNOSIS — Z3A38 38 weeks gestation of pregnancy: Secondary | ICD-10-CM

## 2023-06-01 DIAGNOSIS — O09613 Supervision of young primigravida, third trimester: Secondary | ICD-10-CM | POA: Diagnosis not present

## 2023-06-01 DIAGNOSIS — O9832 Other infections with a predominantly sexual mode of transmission complicating childbirth: Secondary | ICD-10-CM | POA: Diagnosis not present

## 2023-06-01 DIAGNOSIS — O368130 Decreased fetal movements, third trimester, not applicable or unspecified: Secondary | ICD-10-CM | POA: Diagnosis not present

## 2023-06-01 LAB — RPR: RPR Ser Ql: NONREACTIVE

## 2023-06-01 MED ORDER — IBUPROFEN 800 MG PO TABS
800.0000 mg | ORAL_TABLET | Freq: Three times a day (TID) | ORAL | Status: DC
  Administered 2023-06-02 – 2023-06-03 (×4): 800 mg via ORAL
  Filled 2023-06-01 (×4): qty 1

## 2023-06-01 MED ORDER — TERBUTALINE SULFATE 1 MG/ML IJ SOLN
0.2500 mg | Freq: Once | INTRAMUSCULAR | Status: AC | PRN
  Administered 2023-06-01: 0.25 mg via SUBCUTANEOUS
  Filled 2023-06-01: qty 1

## 2023-06-01 MED ORDER — LIDOCAINE HCL (PF) 1 % IJ SOLN
INTRAMUSCULAR | Status: DC | PRN
  Administered 2023-06-01: 11 mL via EPIDURAL

## 2023-06-01 MED ORDER — FENTANYL-BUPIVACAINE-NACL 0.5-0.125-0.9 MG/250ML-% EP SOLN
12.0000 mL/h | EPIDURAL | Status: DC | PRN
  Administered 2023-06-01: 12 mL/h via EPIDURAL
  Filled 2023-06-01: qty 250

## 2023-06-01 MED ORDER — FENTANYL CITRATE (PF) 100 MCG/2ML IJ SOLN
100.0000 ug | INTRAMUSCULAR | Status: DC | PRN
  Administered 2023-06-01 (×2): 100 ug via INTRAVENOUS

## 2023-06-01 MED ORDER — MISOPROSTOL 25 MCG QUARTER TABLET
25.0000 ug | ORAL_TABLET | Freq: Once | ORAL | Status: AC
  Administered 2023-06-01: 25 ug via ORAL
  Filled 2023-06-01: qty 1

## 2023-06-01 MED ORDER — OXYCODONE HCL 5 MG PO TABS
5.0000 mg | ORAL_TABLET | Freq: Four times a day (QID) | ORAL | Status: DC | PRN
Start: 2023-06-01 — End: 2023-06-03
  Administered 2023-06-03: 5 mg via ORAL
  Filled 2023-06-01: qty 1

## 2023-06-01 MED ORDER — WITCH HAZEL-GLYCERIN EX PADS: 1.0000 | MEDICATED_PAD | CUTANEOUS | Status: DC | PRN

## 2023-06-01 MED ORDER — OXYCODONE HCL 5 MG PO TABS: 10.0000 mg | ORAL_TABLET | Freq: Four times a day (QID) | ORAL | Status: DC | PRN

## 2023-06-01 MED ORDER — DIPHENHYDRAMINE HCL 25 MG PO CAPS: 25.0000 mg | ORAL_CAPSULE | Freq: Four times a day (QID) | ORAL | Status: DC | PRN

## 2023-06-01 MED ORDER — PHENYLEPHRINE 80 MCG/ML (10ML) SYRINGE FOR IV PUSH (FOR BLOOD PRESSURE SUPPORT)
80.0000 ug | PREFILLED_SYRINGE | INTRAVENOUS | Status: DC | PRN
  Filled 2023-06-01: qty 10

## 2023-06-01 MED ORDER — PHENYLEPHRINE 80 MCG/ML (10ML) SYRINGE FOR IV PUSH (FOR BLOOD PRESSURE SUPPORT): 80.0000 ug | PREFILLED_SYRINGE | INTRAVENOUS | Status: DC | PRN

## 2023-06-01 MED ORDER — SIMETHICONE 80 MG PO CHEW: 80.0000 mg | CHEWABLE_TABLET | ORAL | Status: DC | PRN

## 2023-06-01 MED ORDER — MEDROXYPROGESTERONE ACETATE 150 MG/ML IM SUSP: 150.0000 mg | INTRAMUSCULAR | Status: DC | PRN

## 2023-06-01 MED ORDER — DIBUCAINE (PERIANAL) 1 % EX OINT: 1.0000 | TOPICAL_OINTMENT | CUTANEOUS | Status: DC | PRN

## 2023-06-01 MED ORDER — TERBUTALINE SULFATE 1 MG/ML IJ SOLN
INTRAMUSCULAR | Status: AC
  Filled 2023-06-01: qty 1

## 2023-06-01 MED ORDER — OXYTOCIN-SODIUM CHLORIDE 30-0.9 UT/500ML-% IV SOLN
1.0000 m[IU]/min | INTRAVENOUS | Status: DC
  Administered 2023-06-01: 2 m[IU]/min via INTRAVENOUS
  Filled 2023-06-01: qty 500

## 2023-06-01 MED ORDER — MISOPROSTOL 25 MCG QUARTER TABLET
25.0000 ug | ORAL_TABLET | Freq: Once | ORAL | Status: AC
Start: 2023-06-01 — End: 2023-06-01
  Administered 2023-06-01: 25 ug via VAGINAL

## 2023-06-01 MED ORDER — ONDANSETRON HCL 4 MG/2ML IJ SOLN: 4.0000 mg | INTRAMUSCULAR | Status: DC | PRN

## 2023-06-01 MED ORDER — BENZOCAINE-MENTHOL 20-0.5 % EX AERO
1.0000 | INHALATION_SPRAY | CUTANEOUS | Status: DC | PRN
  Administered 2023-06-02: 1 via TOPICAL
  Filled 2023-06-01: qty 56

## 2023-06-01 MED ORDER — LACTATED RINGERS IV SOLN
500.0000 mL | Freq: Once | INTRAVENOUS | Status: AC
Start: 2023-06-01 — End: 2023-06-01
  Administered 2023-06-01: 500 mL via INTRAVENOUS

## 2023-06-01 MED ORDER — SENNOSIDES-DOCUSATE SODIUM 8.6-50 MG PO TABS
2.0000 | ORAL_TABLET | Freq: Every day | ORAL | Status: DC
  Administered 2023-06-02: 2 via ORAL
  Filled 2023-06-01: qty 2

## 2023-06-01 MED ORDER — ONDANSETRON HCL 4 MG PO TABS
4.0000 mg | ORAL_TABLET | ORAL | Status: DC | PRN
  Administered 2023-06-02: 4 mg via ORAL
  Filled 2023-06-01: qty 1

## 2023-06-01 MED ORDER — COCONUT OIL OIL: 1.0000 | TOPICAL_OIL | Status: DC | PRN

## 2023-06-01 MED ORDER — EPHEDRINE 5 MG/ML INJ: 10.0000 mg | INTRAVENOUS | Status: DC | PRN

## 2023-06-01 MED ORDER — DIPHENHYDRAMINE HCL 50 MG/ML IJ SOLN: 12.5000 mg | INTRAMUSCULAR | Status: DC | PRN

## 2023-06-01 MED ORDER — EPHEDRINE 5 MG/ML INJ
10.0000 mg | INTRAVENOUS | Status: DC | PRN
  Filled 2023-06-01: qty 5

## 2023-06-01 MED ORDER — ZOLPIDEM TARTRATE 5 MG PO TABS: 5.0000 mg | ORAL_TABLET | Freq: Every evening | ORAL | Status: DC | PRN

## 2023-06-01 MED ORDER — ACETAMINOPHEN 500 MG PO TABS
1000.0000 mg | ORAL_TABLET | Freq: Three times a day (TID) | ORAL | Status: DC
  Administered 2023-06-02 – 2023-06-03 (×4): 1000 mg via ORAL
  Filled 2023-06-01 (×4): qty 2

## 2023-06-01 NOTE — Discharge Summary (Signed)
 Postpartum Discharge Summary     Patient Name: Jenny [name] DOB: 2006/04/08 MRN: [id number]  Date of admission: 05/31/2023 Delivery date:06/01/2023 Delivering provider: NICHOLAUS ALMARIE HERO Date of discharge: 06/03/2023  Admitting diagnosis: Indication for care in labor or delivery [O75.9] Intrauterine pregnancy: [redacted]w[redacted]d     Secondary diagnosis:  Principal Problem:   SVD (spontaneous vaginal delivery) Active Problems:   HSV-2 infection   Supervision of other normal pregnancy, antepartum   Anemia in pregnancy, third trimester  Additional problems: None    Discharge diagnosis: Term Pregnancy Delivered                                              Post partum procedures: None Augmentation: AROM, Pitocin , Cytotec , and IP Foley Complications: None  Hospital course: Induction of Labor With Vaginal Delivery   18 y.o. yo G1P1001 at [redacted]w[redacted]d was admitted to the hospital 05/31/2023 for induction of labor.  Indication for induction:  DFM .  Patient had an labor course complicated by none. Membrane Rupture Time/Date: 2:10 PM,06/01/2023  Delivery Method:Vaginal, Spontaneous Operative Delivery:N/A Episiotomy: None Lacerations:  2nd degree;Periurethral Details of delivery can be found in separate delivery note.  Patient had a postpartum course complicated by none. Patient is discharged home 06/03/23.  Newborn Data: Birth date:06/01/2023 Birth time:8:51 PM Gender:Female Living status:Living Apgars:8 ,9  Weight:3030 g  Magnesium Sulfate received: No BMZ received: No Rhophylac:No MMR:No T-DaP: Declined Flu: No RSV Vaccine received: No Transfusion:No  Immunizations received: Immunization History  Administered Date(s) Administered   DTaP 10/05/2003, 12/05/2003, 02/15/2004, 10/03/2004, 12/01/2007   HIB (PRP-OMP) 10/05/2003, 12/05/2003, 07/16/2004   Hepatitis A 07/16/2004, 01/16/2005   Hepatitis B 2004-04-15, 12/05/2003, 02/15/2004   IPV 10/05/2003, 12/05/2003, 07/16/2004,  12/01/2007   MMR 07/16/2004, 12/01/2007   MenQuadfi_Meningococcal Groups ACYW Conjugate 07/13/2020   Meningococcal Conjugate 02/07/2015   Pneumococcal Conjugate-13 10/05/2003, 12/05/2003, 02/15/2004, 07/16/2004   Tdap 12/13/2014   Varicella 07/16/2004, 11/29/2008    Physical exam  Vitals:   06/02/23 1529 06/02/23 1631 06/02/23 2346 06/03/23 0503  BP: 117/87 105/80 117/82 107/75  Pulse: 86 86 78 71  Resp:   18 18  Temp:   98.2 F (36.8 C) 98.2 F (36.8 C)  TempSrc:   Oral Oral  SpO2:   100% 100%  Weight:      Height:       General: alert, cooperative, and no distress Lochia: appropriate Uterine Fundus: firm Incision: N/A DVT Evaluation: No evidence of DVT seen on physical exam. Labs: Lab Results  Component Value Date   WBC 6.9 05/31/2023   HGB 12.7 05/31/2023   HCT 39.1 05/31/2023   MCV 82.1 05/31/2023   PLT 158 05/31/2023      Latest Ref Rng & Units 05/31/2023    9:42 PM  CMP  Glucose 70 - 99 mg/dL 82   BUN 6 - 20 mg/dL 7   Creatinine 9.55 - 8.99 mg/dL 9.35   Sodium 864 - 854 mmol/L 135   Potassium 3.5 - 5.1 mmol/L 4.1   Chloride 98 - 111 mmol/L 103   CO2 22 - 32 mmol/L 21   Calcium 8.9 - 10.3 mg/dL 9.2   Total Protein 6.5 - 8.1 g/dL 6.7   Total Bilirubin 0.0 - 1.2 mg/dL 0.5   Alkaline Phos 38 - 126 U/L 218   AST 15 - 41 U/L 31   ALT  0 - 44 U/L 13    Edinburgh Score:    06/02/2023    2:38 PM  Edinburgh Postnatal Depression Scale Screening Tool  I have been able to laugh and see the funny side of things. 3  I have looked forward with enjoyment to things. 2  I have blamed myself unnecessarily when things went wrong. 1  I have been anxious or worried for no good reason. 2  I have felt scared or panicky for no good reason. 1  Things have been getting on top of me. 0  I have been so unhappy that I have had difficulty sleeping. 0  I have felt sad or miserable. 0  I have been so unhappy that I have been crying. 1  The thought of harming myself has occurred to  me. 0  Edinburgh Postnatal Depression Scale Total 10   Edinburgh Postnatal Depression Scale Total: (!) 10   After visit meds:  Allergies as of 06/03/2023       Reactions   Iron  Dextran Anaphylaxis   Venofer  [iron  Sucrose] Hives   Watermelon [citrullus Vulgaris] Other (See Comments)   Mouth starts burning (patient reported)        Medication List     STOP taking these medications    Doxylamine -Pyridoxine  10-10 MG Tbec Commonly known as: Diclegis    metroNIDAZOLE  0.75 % vaginal gel Commonly known as: METROGEL    nystatin  cream Commonly known as: MYCOSTATIN        TAKE these medications    acetaminophen  500 MG tablet Commonly known as: TYLENOL  Take 2 tablets (1,000 mg total) by mouth every 8 (eight) hours.   Blood Pressure Kit Devi 1 Device by Does not apply route once a week.   EPINEPHrine  0.3 mg/0.3 mL Soaj injection Commonly known as: EPI-PEN Inject 0.3 mg into the muscle as needed for anaphylaxis.   ferrous sulfate  325 (65 FE) MG tablet Take 1 tablet (325 mg total) by mouth every other day.   ibuprofen  800 MG tablet Commonly known as: ADVIL  Take 1 tablet (800 mg total) by mouth every 8 (eight) hours.   Prenatal Vitamin 27-0.8 MG Tabs Take 1 tablet by mouth daily.   senna-docusate 8.6-50 MG tablet Commonly known as: Senokot-S Take 2 tablets by mouth daily.         Discharge home in stable condition Infant Feeding: Bottle and Breast Infant Disposition:home with mother Discharge instruction: per After Visit Summary and Postpartum booklet. Activity: Advance as tolerated. Pelvic rest for 6 weeks.  Diet: routine diet Future Appointments: Future Appointments  Date Time Provider Department Center  06/09/2023  9:55 AM Constant, Winton, MD [facility] None   Follow up Visit:  Message sent to [facility] 06/01/23  Please schedule this patient for a In person postpartum visit in 6 weeks with the following provider: Any provider. Additional Postpartum F/U: none    Low risk pregnancy complicated by:  teen pregnancy, hx HSV Delivery mode:  Vaginal, Spontaneous Anticipated Birth Control:  Condoms   06/03/2023 Almarie CHRISTELLA Moats, MD OB Fellow

## 2023-06-01 NOTE — Progress Notes (Signed)
Patient ID: Ruhee Enck, female   DOB: Mar 30, 2006, 18 y.o.   MRN: [id number] Labor Progress Note Alec Mcphee is a 18 y.o. G1P0000 at [redacted]w[redacted]d presented for IOL for NRNST.  S: Laying comfortably in bed after epidural  O:  BP (!) 126/94   Pulse 85   Temp 98.8 F (37.1 C) (Oral)   Resp 16   Ht 5\' 4"  (1.626 m)   Wt 74.8 kg   LMP 09/01/2022   SpO2 98%   BMI 28.31 kg/m  EFM: 135/+accels/+earlies  CVE: Dilation: 4 Effacement (%): 80 Cervical Position: Posterior Station: -1 Presentation: Vertex Exam by:: Dr Acquanetta Belling   A&P: 18 y.o. G1P0000 [redacted]w[redacted]d here for IOL #Labor: Progressing well. S/p FB. Discussed AROM with patient and support group. Patient agrees to AROM, tolerated procedure well. Plan to watch for 1 hour to determine contraction pattern. May add pitocin per protocol.  #Pain: epidural #FWB: cat 1 #GBS negative  Margie Billet, MD 3:40 PM

## 2023-06-01 NOTE — Anesthesia Preprocedure Evaluation (Signed)
Anesthesia Evaluation  Patient identified by MRN, date of birth, ID band Patient awake    Reviewed: Allergy & Precautions, H&P , NPO status , Patient's Chart, lab work & pertinent test results  Airway Mallampati: II  TM Distance: >3 FB Neck ROM: Full    Dental no notable dental hx.    Pulmonary neg pulmonary ROS   Pulmonary exam normal breath sounds clear to auscultation       Cardiovascular negative cardio ROS Normal cardiovascular exam Rhythm:Regular Rate:Normal     Neuro/Psych  Headaches  negative psych ROS   GI/Hepatic negative GI ROS, Neg liver ROS,,,  Endo/Other  negative endocrine ROS    Renal/GU negative Renal ROS  negative genitourinary   Musculoskeletal negative musculoskeletal ROS (+)    Abdominal   Peds negative pediatric ROS (+)  Hematology negative hematology ROS (+)   Anesthesia Other Findings   Reproductive/Obstetrics (+) Pregnancy                             Anesthesia Physical Anesthesia Plan  ASA: 2  Anesthesia Plan: Epidural   Post-op Pain Management:    Induction:   PONV Risk Score and Plan:   Airway Management Planned:   Additional Equipment:   Intra-op Plan:   Post-operative Plan:   Informed Consent:   Plan Discussed with:   Anesthesia Plan Comments:        Anesthesia Quick Evaluation

## 2023-06-01 NOTE — Progress Notes (Signed)
Labor Progress Note Jenny [name] is a 18 y.o. G1P0000 at [redacted]w[redacted]d presented for IOL for DFM at term.   S:  Coping well, although anxious.   O:  BP 127/89   Pulse 82   Temp 98.6 F (37 C) (Oral)   Resp 16   Ht 5\' 4"  (1.626 m)   Wt 74.8 kg   LMP 09/01/2022   SpO2 99%   BMI 28.31 kg/m   EFM: baseline 135 bpm/ moderate variability/ 15x15 accels/ variable decels  Toco/IUPC: 2-5 with UI  SVE: Dilation: Fingertip Effacement (%): 50 Cervical Position: Posterior Station: -3 Presentation: Undeterminable Exam by:: Richard RNC Pitocin: 0 mu/min  A/P: 18 y.o. G1P0000 [redacted]w[redacted]d IOL for DFM at term.   1. Labor: IOL in latent phase of labor. Cytotec 50 mcg, attempted placement of vaginally and patient with difficulty in tolerating exam. Patient verbally consenting, however, unable to relax and  so procedure terminated. Consider IV fentanyl before next exam, plan FB placement as able.  2. FWB: Cat 2, overall reassuring 3. Pain: Coping well. IV medication or epidural on request.  4. GBS negative   Anticipate NVSB.  Richardson Landry, CNM 12:03 AM

## 2023-06-01 NOTE — Anesthesia Procedure Notes (Signed)
Epidural Patient location during procedure: OB Start time: 06/01/2023 11:07 AM End time: 06/01/2023 11:25 AM  Staffing Anesthesiologist: Lowella Curb, MD Performed: anesthesiologist   Preanesthetic Checklist Completed: patient identified, IV checked, site marked, risks and benefits discussed, surgical consent, monitors and equipment checked, pre-op evaluation and timeout performed  Epidural Patient position: sitting Prep: ChloraPrep Patient monitoring: heart rate, cardiac monitor, continuous pulse ox and blood pressure Approach: midline Location: L2-L3 Injection technique: LOR saline  Needle:  Needle type: Tuohy  Needle gauge: 17 G Needle length: 9 cm Needle insertion depth: 6 cm Catheter type: closed end flexible Catheter size: 20 Guage Catheter at skin depth: 10 cm Test dose: negative  Assessment Events: blood not aspirated, injection not painful, no injection resistance, no paresthesia and negative IV test  Additional Notes Reason for block:procedure for pain

## 2023-06-01 NOTE — Progress Notes (Addendum)
Labor Progress Note Jenny [name] is a 18 y.o. G1P0000 at [redacted]w[redacted]d presented for IOL 2/2 NRFHR in MAU at term.   S:  More uncomfortable, managing well with IV fentanyl.   O:  BP 121/73   Pulse 81   Temp 98.6 F (37 C) (Oral)   Resp 16   Ht 5\' 4"  (1.626 m)   Wt 74.8 kg   LMP 09/01/2022   SpO2 100%   BMI 28.31 kg/m   EFM: baseline 130 bpm/ moderate variability/ 15x15 accels/ absent decels  Toco/IUPC: 1-4 SVE: Dilation: 2 Effacement (%): 90 Cervical Position: Posterior Station: -3 Presentation: Vertex Exam by:: S. Warren-Hill, CNM Pitocin: 0 mu/min  A/P: 18 y.o. G1P0000 [redacted]w[redacted]d  IOL 2/2 MRFHR in MAU at term.   1. Labor: IOL in latent phase s/p cytotec. RBA of foley balloon counseled, patient consents to and requested this procedure. Fentanyl administered, ensured effective, and FB placed and filled with 40mL. Patient tolerated well. Consider AROM once out.  2. FWB: Cat 1 3. Pain: Fentanyl IV now, epidural on request.  4. GBS neg   Anticipate NVSB.  Richardson Landry, CNM 8:30 AM

## 2023-06-01 NOTE — Lactation Note (Signed)
This note was copied from a baby's chart. Lactation Consultation Note  Patient Name: Jenny [name] [id number] Date: 06/01/2023 Age:20 hours  Attempted to see mom but she is still in repair.   Maternal Data    Feeding    LATCH Score                    Lactation Tools Discussed/Used    Interventions    Discharge    Consult Status      Charyl Dancer 06/01/2023, 8:59 PM

## 2023-06-01 NOTE — Progress Notes (Signed)
CNM called to bedside due to FHR deceleration. RN staff already at bedside and had tried position changes - both left and right lateral. CNM directed staff/patient to hands and knees, IV fluid bolus ( ) and terbutaline SQ.   Counseled patient on concern regarding recent administration of cytotec and poor fetal response. Terbutaline should offer respite from contractions to allow fetal recovery. Plan to observe and anticipate FB placement when able.  Dr. Shawnie Pons on unit and aware of deceleration and interventions.  Lamont Snowball, MSN, CNM, RNC-OB Certified Nurse Midwife, [facility] 06/01/2023 1:08 AM

## 2023-06-01 NOTE — Progress Notes (Signed)
Labor Progress Note Jenny [name] is a 18 y.o. G1P0000 at [redacted]w[redacted]d presented for DFM at term, decision made to proceed with IOL.   S:  Coping well, though anxious.   O:  BP 128/81   Pulse 86   Temp 98.6 F (37 C) (Oral)   Resp 16   Ht 5\' 4"  (1.626 m)   Wt 74.8 kg   LMP 09/01/2022   SpO2 99%   BMI 28.31 kg/m   EFM: baseline 155 bpm/ moderate variability/ 15X15 accels/  variable decels  Toco/IUPC: 2-5 SVE: Dilation: Fingertip Effacement (%): 50 Cervical Position: Posterior Station: -3 Presentation: Vertex Exam by:: Huntley Dec, CNM Pitocin: 0 mu/min  A/P: 18 y.o. G1P0000 [redacted]w[redacted]d presented for DFM at term, decision made to proceed with IOL.  1. Labor: IOL in latent phase. Fentanyl administered before SCE, with patient able to tolerate exam much better. cytotec placed vaginally. Plan FB placement when able. Consider PO cytotec, dependent on fetal tolerance of this PV dose.  2. FWB: Cat 2,  3. Pain: Coping well, fentanyl now for SCE. Epidural on request 4. GBS negative   Anticipate NVSB.  Richardson Landry, CNM 4:01 AM

## 2023-06-01 NOTE — Progress Notes (Signed)
Called to room for NRFHT, FHT in the 80s.  Went to the room and pt with remained in 80s despite rotation to L lateral position.  Pt rolled onto her back with continued decel with every contractions but only mild recovery to 100s (<110).  Pit turned off, peripheral bolus given and terb x 1 IM given also.  CVE unchanged from 6/90/0 per nursing report (pt refused female provider check at this time). Had return to baseline of 120s with what appear to be early decelerations with each contraction.  Pt counseled on IUPC and FSE however became overwhelmed.  With reassuring tracing will give patient time and be available for questions / concerns as needed, otherwise will CTM closely and offer IUPC and FSE after pt and baby fully recovered from incident.    Mittie Bodo, MD Family Medicine - Obstetrics Fellow

## 2023-06-02 ENCOUNTER — Encounter: Payer: 59 | Admitting: Obstetrics

## 2023-06-02 NOTE — Lactation Note (Signed)
This note was copied from a baby's chart. Lactation Consultation Note  Patient Name: Jenny [name] [id number] Date: 06/02/2023 Age:20 hours Reason for consult: Follow-up assessment;Exclusive pumping and bottle feeding Per MOB, she has decided to Pump only and formula feed infant. LC set up DEBP and explained how to use. MOB plans to pump later due to family visiting, MOB knows if she has any pumping questions or concerns she can ask RN or LC services. MOB understand that her EBM is safe for 4 hours whereas formula once open is safe for 1 hour at room temperature. MOB informed LC she has DEBP at home.   Maternal Data    Feeding Mother's Current Feeding Choice: Breast Milk and Formula Nipple Type: Slow - flow  LATCH Score  MOB plans to pump only and formula.                  Lactation Tools Discussed/Used Tools: Pump;Flanges Flange Size: 18 Breast pump type: Double-Electric Breast Pump Pump Education: Setup, frequency, and cleaning;Milk Storage Reason for Pumping: MOB decided she wants to pump only and formula feed infant. Pumping frequency: MOB plans to pump every 3 hours for 15 minutes on inital setting.  Interventions Interventions: DEBP;Education;Pace feeding;Guidelines for Milk Supply and Pumping Schedule Handout;CDC Guidelines for Breast Pump Cleaning  Discharge Pump: DEBP;Personal;Manual  Consult Status Consult Status: Follow-up Date: 06/03/23 Follow-up type: In-patient    Frederico Hamman 06/02/2023, 6:08 PM

## 2023-06-02 NOTE — Progress Notes (Signed)
Post Partum Day 1 Subjective: no complaints, tolerating PO, and + flatus  Objective: Blood pressure 118/74, pulse 81, temperature 98.3 F (36.8 C), temperature source Oral, resp. rate 18, height 5\' 4"  (1.626 m), weight 74.8 kg, last menstrual period 09/01/2022, SpO2 99%, unknown if currently breastfeeding.  Physical Exam:  General: alert, cooperative, and no distress Lochia: appropriate Uterine Fundus: firm Incision: n/a DVT Evaluation: No evidence of DVT seen on physical exam. Negative Homan's sign. No cords or calf tenderness.  Recent Labs    05/31/23 2142  HGB 12.7  HCT 39.1    Assessment/Plan: Plan for discharge tomorrow   LOS: 2 days   Levie Heritage, DO 06/02/2023, 9:43 AM

## 2023-06-02 NOTE — Anesthesia Postprocedure Evaluation (Signed)
Anesthesia Post Note  Patient: Jenny [name]  Procedure(s) Performed: AN AD HOC LABOR EPIDURAL     Patient location during evaluation: Mother Baby Anesthesia Type: Epidural Level of consciousness: awake and alert Pain management: pain level controlled Vital Signs Assessment: post-procedure vital signs reviewed and stable Respiratory status: spontaneous breathing, nonlabored ventilation and respiratory function stable Cardiovascular status: stable Postop Assessment: no headache, no backache and epidural receding Anesthetic complications: no   No notable events documented.  Last Vitals:  Vitals:   06/02/23 0510 06/02/23 0811  BP: 120/77 118/74  Pulse: 83 81  Resp: 18 18  Temp: 37.6 C 36.8 C  SpO2:  99%    Last Pain:  Vitals:   06/02/23 0811  TempSrc: Oral  PainSc: 0-No pain   Pain Goal: Patients Stated Pain Goal: 0 (06/01/23 1102)              Epidural/Spinal Function Cutaneous sensation: Normal sensation (06/02/23 0811), Patient able to flex knees: Yes (06/02/23 0811), Patient able to lift hips off bed: Yes (06/02/23 0811), Back pain beyond tenderness at insertion site: No (06/02/23 0811), Progressively worsening motor and/or sensory loss: No (06/02/23 0811), Bowel and/or bladder incontinence post epidural: No (06/02/23 0811)  Fanny Dance

## 2023-06-02 NOTE — Lactation Note (Addendum)
This note was copied from a baby's chart. Lactation Consultation Note  Patient Name: Jenny [name] [id number] Date: 06/02/2023 Age:20 hours Reason for consult: Initial assessment;Primapara;Term Mom isn't sure if she wants to try to BF or not. Stated she might try but not tonight. LC suggested mom call for Lactation if she does decide to BF. Support person stated she doesn't want her pressured. LC stated no pressure it's mom decision. Mentioned when mom's mature milk will come in, how often baby needs to eat.  Maternal Data    Feeding Nipple Type: Slow - flow  LATCH Score                    Lactation Tools Discussed/Used    Interventions    Discharge    Consult Status Consult Status: Complete    Kyrielle Urbanski G 06/02/2023, 12:30 AM

## 2023-06-03 MED ORDER — SENNOSIDES-DOCUSATE SODIUM 8.6-50 MG PO TABS: 2.0000 | ORAL_TABLET | Freq: Every day | ORAL | 1 refills | Status: AC

## 2023-06-03 MED ORDER — ACETAMINOPHEN 500 MG PO TABS: 1000.0000 mg | ORAL_TABLET | Freq: Three times a day (TID) | ORAL | 1 refills | Status: AC

## 2023-06-03 MED ORDER — IBUPROFEN 800 MG PO TABS: 800.0000 mg | ORAL_TABLET | Freq: Three times a day (TID) | ORAL | 1 refills | Status: AC

## 2023-06-03 NOTE — Discharge Instructions (Signed)
 WHAT TO LOOK OUT FOR: Fever of 100.4 or above Mastitis: feels like flu and breasts hurt Infection: increased pain, swelling or redness Blood clots golf ball size or larger Postpartum depression   Congratulations on your newest addition!

## 2023-06-03 NOTE — Progress Notes (Signed)
 CSW received consult due to score 10 on Edinburgh Depression Screen. CSW met MOB at bedside to complete a mental health assessment and offer support. CSW entered the room, introduced herself and acknowledged she had family present. CSW asked MOB for privacy reasons could her family step out for the assessment; MOB was agreeable and her guest stepped out. CSW explained her role and the reason for the visit. MOB presented bonding/holding the infant and was calm, agreeable to consult and remained engaged throughout encounter.   CSW acknowledged Edinburgh score of 10 and MOB reported just trying to adjust/new mom worries. Patient declines a referral to [employer]. Patient verbalizes understanding that the appointment will be virtual. CSW inquired about MOB's mental health history. MOB denied any/all mental health history. CSW asked MOB how she is currently feeling since deliver. MOB reported she is good and excited about being a mom. MOB reported her supports as her mom and grandmothers who were present today. CSW provided education regarding Baby Blues vs PMADs and provided MOB with resources for mental health follow up.  CSW encouraged MOB to evaluate her mental health throughout the postpartum period with the use of the New Mom Checklist developed by Postpartum Progress as well as the Edinburgh Postnatal Depression Scale and notify a medical professional if symptoms arise.  CSW assessed for safety with MOB SI/HI/DV;MOB denied all.  CSW asked MOB has she selected a pediatrician for the infant's follow up visits; MOB said [facility].  MOB reported having all essential items for the infant including a carseat, bassinet and crib for safe sleeping. CSW provided review of Sudden Infant Death Syndrome (SIDS) precautions.  CSW identifies no further need for intervention and no barriers to discharge at this time.  Rosina Molt, [place] Clinical Social Worker [phone]

## 2023-06-03 NOTE — Lactation Note (Signed)
 This note was copied from a baby's chart. Lactation Consultation Note  Patient Name: Girl Linday Rhodes Unijb'd Date: 06/03/2023 Age:20 hours Reason for consult: Follow-up assessment;Exclusive pumping and bottle feeding;Infant weight loss;Term (3 % weight loss,) LC reviewed and updated the doc flow sheets.  Per mom has only pumped x 1 with the DEBP and no results.  LC reassured mom pumping only can be a slow process, but being consistent around the clock 8-10 times a day is the key to enhancing volume.  LC reviewed engorgement prevention and tx ,  LC resources.    Maternal Data Does the patient have breastfeeding experience prior to this delivery?: No  Feeding Mother's Current Feeding Choice: Breast Milk and Formula Nipple Type: Slow - flow   Lactation Tools Discussed/Used Tools: Pump;Flanges Flange Size: 18 Breast pump type: Double-Electric Breast Pump Pump Education: Setup, frequency, and cleaning;Milk Storage Pumped volume: 0 mL (per has started pumpng during the night) Interventions Interventions: Breast feeding basics reviewed;Education;Hand pump;DEBP;LC Services brochure;CDC Guidelines for Breast Pump Cleaning  Discharge Discharge Education: Engorgement and breast care;Warning signs for feeding baby Pump: Personal;Manual;Hands Free  Consult Status Consult Status: Complete Date: 06/03/23    Rollene Jenkins Fiedler 06/03/2023, 9:17 AM

## 2023-06-09 ENCOUNTER — Encounter: Payer: 59 | Admitting: Obstetrics and Gynecology

## 2023-06-10 ENCOUNTER — Telehealth (HOSPITAL_COMMUNITY): Payer: Self-pay

## 2023-06-10 NOTE — Telephone Encounter (Signed)
06/10/2023 1638  Name: Jenny [name] MRN: [id number] DOB: [birth year]  Reason for Call:  Transition of Care Hospital Discharge Call  Contact Status:    Language assistant needed:          Follow-Up Questions: Do You Have Any Concerns About Your Health As You Heal From Delivery?: No Do You Have Any Concerns About Your Infants Health?: No  Edinburgh Postnatal Depression Scale:  In the Past 7 Days:    PHQ2-9 Depression Scale:     Discharge Follow-up: Edinburgh score requires follow up?:  (Explained EPDS to patient and then phone line disconnected. Attempted to call patient again and got no answer, voicemail left.)  Post-discharge interventions: Reviewed Newborn Safe Sleep Practices  Signature  Signe Colt

## 2023-07-21 ENCOUNTER — Ambulatory Visit: Payer: Medicaid Other | Admitting: Obstetrics and Gynecology

## 2023-07-29 IMAGING — DX DG CHEST 2V
2 series · 2 of 2 positions shown · non-contrast
Comparison: None Available.

CLINICAL DATA: Fever, chills, cough, vomiting for 1 week

EXAM:
CHEST - 2 VIEW

[chest pa]
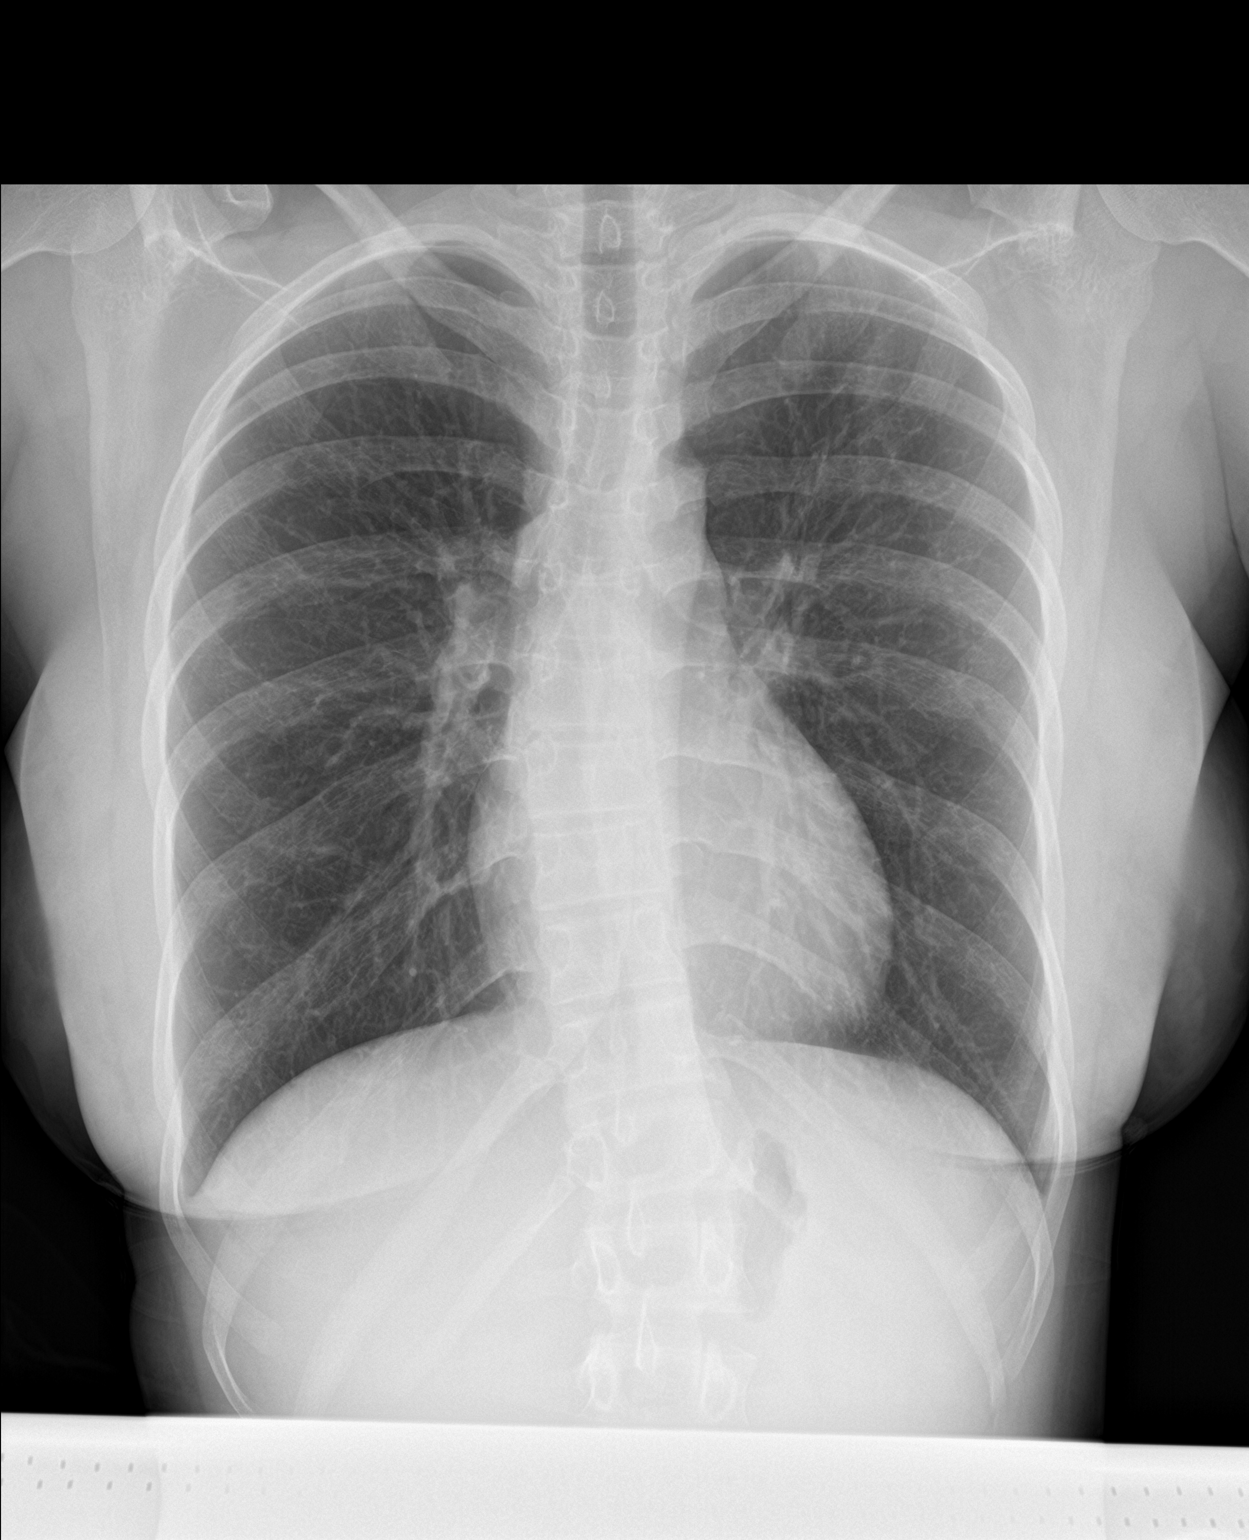

[chest lat]
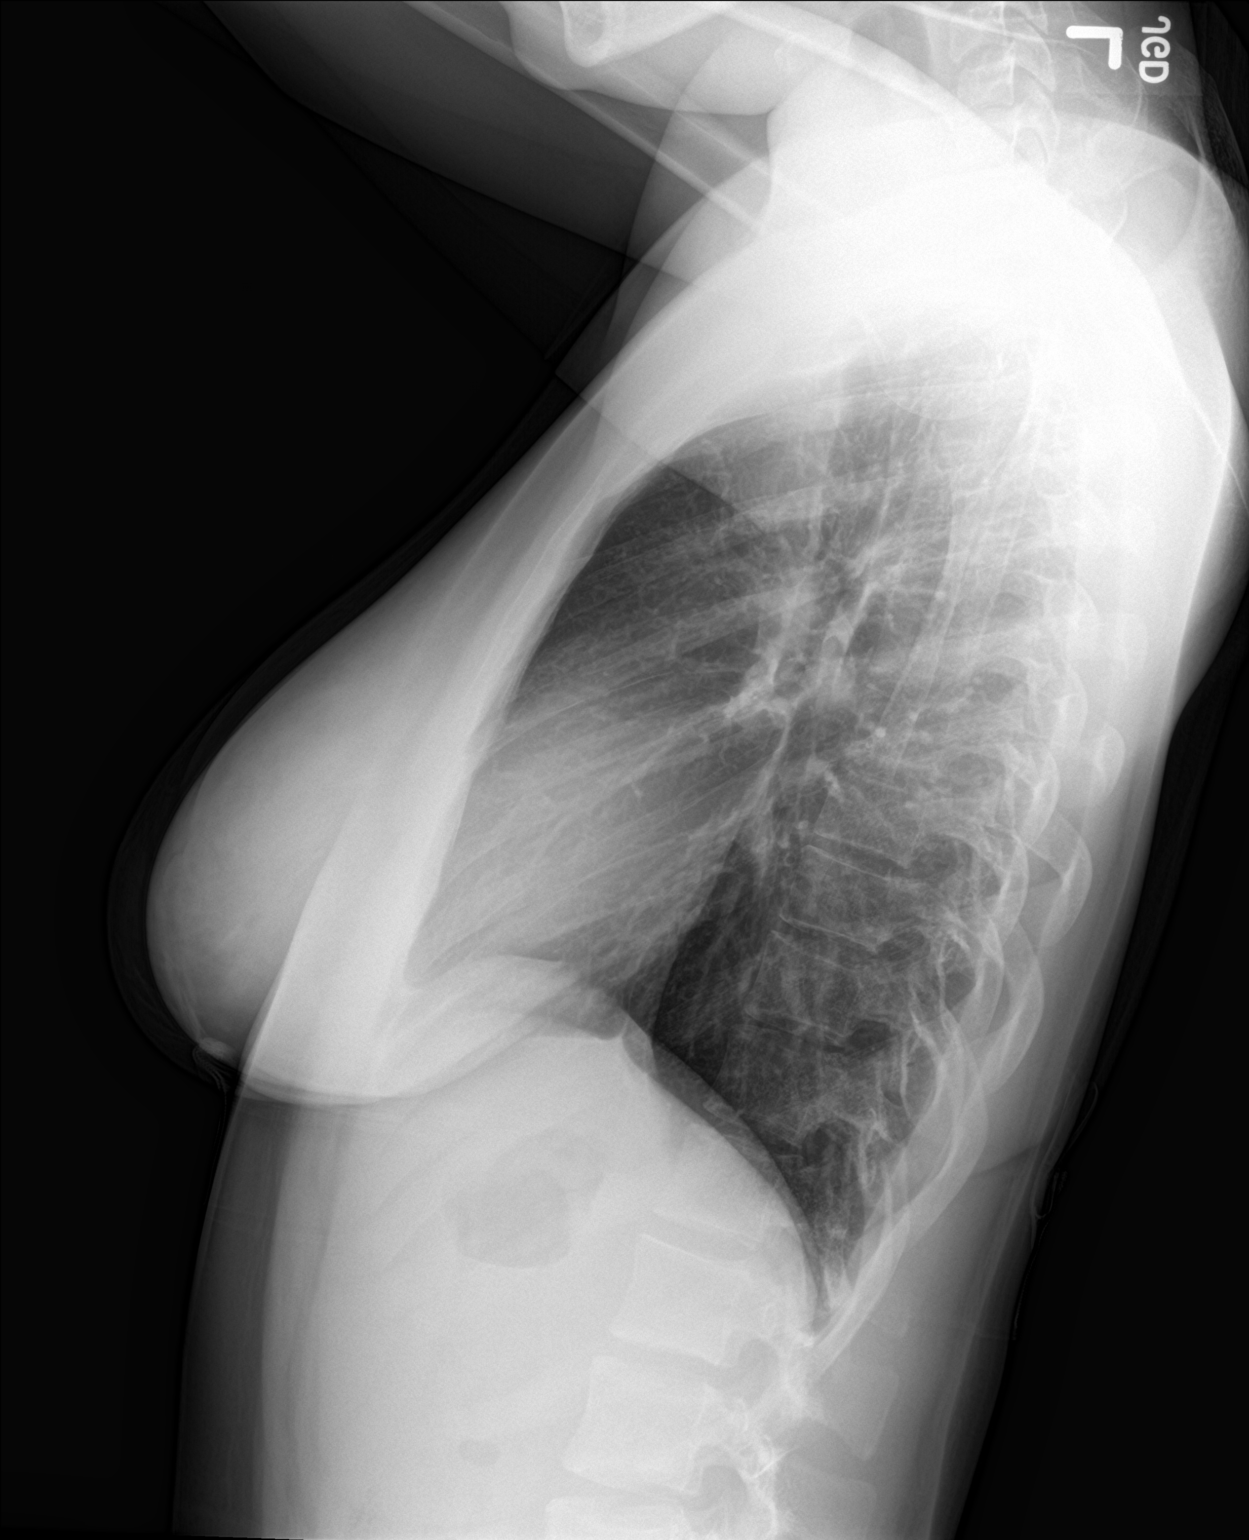

[2 of 2 positions shown; findings below may reference images not displayed]

FINDINGS: Frontal and lateral views of the chest demonstrate an unremarkable
cardiac silhouette. No acute airspace disease, effusion, or
pneumothorax. No acute bony abnormalities.
IMPRESSION: 1. No acute intrathoracic process.

## 2023-10-02 ENCOUNTER — Ambulatory Visit: Payer: Self-pay

## 2024-01-07 ENCOUNTER — Ambulatory Visit (HOSPITAL_COMMUNITY): Payer: Self-pay

## 2024-01-18 ENCOUNTER — Ambulatory Visit (HOSPITAL_COMMUNITY)

## 2024-02-10 ENCOUNTER — Ambulatory Visit (HOSPITAL_COMMUNITY)

## 2024-02-12 ENCOUNTER — Ambulatory Visit (HOSPITAL_COMMUNITY)

## 2024-03-04 ENCOUNTER — Ambulatory Visit (HOSPITAL_COMMUNITY)
